# Patient Record
Sex: Female | Born: 1935 | Race: White | Hispanic: No | Smoking: Former smoker
Health system: Southern US, Community
[De-identification: ages and names within clinical notes are randomized; demographics above are authoritative.]

## PROBLEM LIST (undated history)

## (undated) DIAGNOSIS — R233 Spontaneous ecchymoses: Secondary | ICD-10-CM

## (undated) DIAGNOSIS — R238 Other skin changes: Secondary | ICD-10-CM

## (undated) DIAGNOSIS — J45909 Unspecified asthma, uncomplicated: Secondary | ICD-10-CM

## (undated) HISTORY — DX: Unspecified asthma, uncomplicated: J45.909

## (undated) HISTORY — PX: VESICOVAGINAL FISTULA CLOSURE W/ TAH: SUR271

## (undated) HISTORY — DX: Spontaneous ecchymoses: R23.3

## (undated) HISTORY — DX: Other skin changes: R23.8

---

## 2013-03-31 ENCOUNTER — Encounter: Payer: Self-pay | Admitting: Podiatry

## 2013-04-01 ENCOUNTER — Encounter: Payer: Self-pay | Admitting: Podiatry

## 2013-04-01 ENCOUNTER — Ambulatory Visit (INDEPENDENT_AMBULATORY_CARE_PROVIDER_SITE_OTHER): Payer: Medicare HMO | Admitting: Podiatry

## 2013-04-01 ENCOUNTER — Ambulatory Visit: Payer: Self-pay | Admitting: Podiatry

## 2013-04-01 VITALS — BP 130/56 | HR 85 | Resp 16 | Ht 62.0 in | Wt 131.4 lb

## 2013-04-01 DIAGNOSIS — Q828 Other specified congenital malformations of skin: Secondary | ICD-10-CM

## 2013-04-01 NOTE — Progress Notes (Signed)
She presents today with her daughter with a chief complaint of a painful fifth toe of the right foot set this corn is bothering me.  Objective: Vital signs are stable she is alert and oriented x3 pulses remain palpable right lower extremity. Hammertoe deformity the fifth digit of the right foot does demonstrate a distal porokeratotic lesion to the distal lateral aspect of the hammertoe deformity. There is no signs of infection no erythema edema cellulitis drainage or odor.  Assessment: Porokeratosis hammertoe deformity fifth right.  Plan: Debridement of reactive hyperkeratosis and placed moleskin.

## 2019-08-21 ENCOUNTER — Telehealth: Payer: Self-pay | Admitting: Nurse Practitioner

## 2019-08-21 NOTE — Telephone Encounter (Signed)
Spoke with patient's daughter Efraim Kaufmann regarding Palliative services and she was in agreement with this.  I have scheduled an In-person Consult for 08/31/19 @ 3:30 PM

## 2019-08-24 ENCOUNTER — Emergency Department: Payer: Medicare Other

## 2019-08-24 ENCOUNTER — Emergency Department
Admission: EM | Admit: 2019-08-24 | Discharge: 2019-08-24 | Disposition: A | Discharge status: Home / Self Care | Payer: Medicare Other | Attending: Emergency Medicine | Admitting: Emergency Medicine

## 2019-08-24 ENCOUNTER — Other Ambulatory Visit: Payer: Self-pay

## 2019-08-24 DIAGNOSIS — Z043 Encounter for examination and observation following other accident: Secondary | ICD-10-CM | POA: Diagnosis not present

## 2019-08-24 DIAGNOSIS — R410 Disorientation, unspecified: Secondary | ICD-10-CM | POA: Diagnosis not present

## 2019-08-24 DIAGNOSIS — Z79899 Other long term (current) drug therapy: Secondary | ICD-10-CM | POA: Diagnosis not present

## 2019-08-24 DIAGNOSIS — Y999 Unspecified external cause status: Secondary | ICD-10-CM | POA: Insufficient documentation

## 2019-08-24 DIAGNOSIS — J45909 Unspecified asthma, uncomplicated: Secondary | ICD-10-CM | POA: Diagnosis not present

## 2019-08-24 DIAGNOSIS — Y9389 Activity, other specified: Secondary | ICD-10-CM | POA: Diagnosis not present

## 2019-08-24 DIAGNOSIS — Z87891 Personal history of nicotine dependence: Secondary | ICD-10-CM | POA: Insufficient documentation

## 2019-08-24 DIAGNOSIS — Y92002 Bathroom of unspecified non-institutional (private) residence single-family (private) house as the place of occurrence of the external cause: Secondary | ICD-10-CM | POA: Diagnosis not present

## 2019-08-24 DIAGNOSIS — N390 Urinary tract infection, site not specified: Secondary | ICD-10-CM | POA: Diagnosis not present

## 2019-08-24 DIAGNOSIS — W19XXXA Unspecified fall, initial encounter: Secondary | ICD-10-CM

## 2019-08-24 LAB — COMPREHENSIVE METABOLIC PANEL
ALT: 13 U/L (ref 0–44)
AST: 13 U/L — ABNORMAL LOW (ref 15–41)
Albumin: 3.8 g/dL (ref 3.5–5.0)
Alkaline Phosphatase: 73 U/L (ref 38–126)
Anion gap: 7 (ref 5–15)
BUN: 40 mg/dL — ABNORMAL HIGH (ref 8–23)
CO2: 27 mmol/L (ref 22–32)
Calcium: 9.3 mg/dL (ref 8.9–10.3)
Chloride: 107 mmol/L (ref 98–111)
Creatinine, Ser: 0.97 mg/dL (ref 0.44–1.00)
GFR calc Af Amer: 56 mL/min — ABNORMAL LOW (ref >60)
GFR calc non Af Amer: 49 mL/min — ABNORMAL LOW (ref >60)
Glucose, Bld: 109 mg/dL — ABNORMAL HIGH (ref 70–99)
Potassium: 4.4 mmol/L (ref 3.5–5.1)
Sodium: 141 mmol/L (ref 135–145)
Total Bilirubin: 0.5 mg/dL (ref 0.3–1.2)
Total Protein: 6.2 g/dL — ABNORMAL LOW (ref 6.5–8.1)

## 2019-08-24 LAB — CBC
HCT: 35.8 % — ABNORMAL LOW (ref 36.0–46.0)
Hemoglobin: 10.8 g/dL — ABNORMAL LOW (ref 12.0–15.0)
MCH: 26 pg (ref 26.0–34.0)
MCHC: 30.2 g/dL (ref 30.0–36.0)
MCV: 86.3 fL (ref 80.0–100.0)
Platelets: 248 10*3/uL (ref 150–400)
RBC: 4.15 MIL/uL (ref 3.87–5.11)
RDW: 17.1 % — ABNORMAL HIGH (ref 11.5–15.5)
WBC: 11.3 10*3/uL — ABNORMAL HIGH (ref 4.0–10.5)
nRBC: 0 % (ref 0.0–0.2)

## 2019-08-24 LAB — URINALYSIS, COMPLETE (UACMP) WITH MICROSCOPIC
Bilirubin Urine: NEGATIVE
Glucose, UA: NEGATIVE mg/dL
Hgb urine dipstick: NEGATIVE
Ketones, ur: NEGATIVE mg/dL
Nitrite: POSITIVE — AB
Protein, ur: NEGATIVE mg/dL
Specific Gravity, Urine: 1.021 (ref 1.005–1.030)
pH: 7 (ref 5.0–8.0)

## 2019-08-24 MED ORDER — SODIUM CHLORIDE 0.9 % IV SOLN
1.0000 g | Freq: Once | INTRAVENOUS | Status: AC
  Administered 2019-08-24: 1 g via INTRAVENOUS
  Filled 2019-08-24: qty 10

## 2019-08-24 MED ORDER — SODIUM CHLORIDE 0.9% FLUSH: 3.0000 mL | Freq: Once | INTRAVENOUS | Status: DC

## 2019-08-24 MED ORDER — CEPHALEXIN 500 MG PO CAPS
500.0000 mg | ORAL_CAPSULE | Freq: Two times a day (BID) | ORAL | 0 refills | Status: DC
Start: 2019-08-24 — End: 2019-09-23

## 2019-08-24 NOTE — ED Notes (Signed)
Daughter states after looking at her phone, last time she talked with her mother, the pt was at 1230 and not 2:30

## 2019-08-24 NOTE — ED Triage Notes (Signed)
Pt BIB EMS after a fall. Pt states she fell in the bathroom and then woke up. EMS states  A strong urine smell with some confusion. Daughter states home health was with pt from 9-11am and daughter talked with pt at 2pm. Daughter thinks fall was after 2pm and found by neighbor.

## 2019-08-24 NOTE — ED Notes (Signed)
Pt is confused. Per daughter, this is pt baseline. Pt is verbally accusatory to staff. Lights turned off per pt request and then lights turned on per request. Pt adjusted in bed for comfort. Delay explained to family and pt that she requires IV antibiotics.

## 2019-08-24 NOTE — ED Provider Notes (Signed)
[facility] Emergency Department Provider Note  Time seen: 9:01 PM  I have reviewed the triage vital signs and the nursing notes.   HISTORY  Chief Complaint Fall   HPI Theresa Luna is a 86 y.o. female with a past medical history of asthma, presents to the emergency department after a fall.  According to patient family patient states she fell at some point getting to the bathroom.  She is not sure what time this was.  She is not sure if she lost consciousness and fell where she fell and lost consciousness.  Patient denies any complaints at this time.  Denies any headache, denies chest pain or shortness of breath at any point.  Overall the patient appears well, appears younger than stated age.   Past Medical History:  Diagnosis Date  . Asthma   . Bruises easily     There are no problems to display for this patient.   Past Surgical History:  Procedure Laterality Date  . VESICOVAGINAL FISTULA CLOSURE W/ TAH      Prior to Admission medications   Medication Sig Start Date End Date Taking? Authorizing Provider  ADVAIR DISKUS 250-50 MCG/DOSE AEPB  03/21/13   [provider]  D 1000 1000 UNITS CHEW  02/01/13   [provider]  Fexofenadine HCl (ALLEGRA PO) Take by mouth daily.    [provider]  fluticasone Aleda Grana) 50 MCG/ACT nasal spray  01/29/13   [provider]  montelukast (SINGULAIR) 10 MG tablet Take 10 mg by mouth at bedtime.    [provider]  moxifloxacin (AVELOX) 400 MG tablet  02/11/13   [provider]  nystatin (MYCOSTATIN) 100000 UNIT/ML suspension  03/17/13   [provider]  omeprazole (PRILOSEC) 40 MG capsule  03/17/13   [provider]  oxybutynin (DITROPAN-XL) 10 MG 24 hr tablet  02/09/13   [provider]  Steffanie Rainwater 180 MCG/ACT inhaler  03/14/13   [provider]  SPIRIVA HANDIHALER 18 MCG inhalation capsule  03/18/13   [provider]   Pauline Aus [facility] 45 MCG/ACT inhaler  03/19/13   [provider]    Allergies  Allergen Reactions  . Penicillins Rash    Family History  Problem Relation Age of Onset  . Cancer Father   . High blood pressure Mother     Social History Social History   Tobacco Use  . Smoking status: Former Games developer  . Smokeless tobacco: Never Used  Substance Use Topics  . Alcohol use: No  . Drug use: No    Review of Systems Constitutional: Negative for fever. Cardiovascular: Negative for chest pain. Respiratory: Negative for shortness of breath. Gastrointestinal: Negative for abdominal pain Genitourinary: Negative for urinary compaints Musculoskeletal: Negative for musculoskeletal complaints Skin: Negative for skin complaints  Neurological: Negative for headache All other ROS negative  ____________________________________________   PHYSICAL EXAM:  VITAL SIGNS: ED Triage Vitals  Enc Vitals Group     BP 08/24/19 1957 (!) 151/74     Pulse Rate 08/24/19 1957 85     Resp 08/24/19 1957 20     Temp 08/24/19 1957 98.1 F (36.7 C)     Temp Source 08/24/19 1957 Oral     SpO2 08/24/19 1957 94 %     Weight 08/24/19 1952 104 lb (47.2 kg)     Height 08/24/19 1952 5\' 1"  (1.549 m)     Head Circumference --      Peak Flow --  Pain Score 08/24/19 1952 0     Pain Loc --      Pain Edu? --      Excl. in [place]? --     Constitutional: Alert and oriented. Well appearing and in no distress. Eyes: Normal exam ENT      Head: Normocephalic and atraumatic.      Mouth/Throat: Mucous membranes are moist. Cardiovascular: Normal rate, regular rhythm. Respiratory: Normal respiratory effort without tachypnea nor retractions. Breath sounds are clear  Gastrointestinal: Soft and nontender. No distention. Musculoskeletal: Nontender with normal range of motion in all extremities.  Neurologic:  Normal speech and language. No gross focal neurologic deficits  Skin:  Skin is warm, dry and intact.   Psychiatric: Mood and affect are normal.   ____________________________________________    EKG  EKG viewed and interpreted by myself shows a normal sinus rhythm 84 bpm with a narrow QRS, normal axis, normal intervals, no concerning ST changes.  ____________________________________________    RADIOLOGY  IMPRESSION:  Chronic atrophic and ischemic changes without acute intracranial  abnormality.   ____________________________________________   INITIAL IMPRESSION / ASSESSMENT AND PLAN / ED COURSE  Pertinent labs & imaging results that were available during my care of the patient were reviewed by me and considered in my medical decision making (see chart for details).   Patient presents to the emergency department after a fall.  Unknown if she lost consciousness.  States they last tolerated 2 PM and then found her on the floor a little before 6 PM.  Patient's work-up is overall reassuring besides nitrite positive urinalysis.  We will treat with Rocephin and continue to closely monitor.  CT scan head is negative.  EKG reassuring.  Patient appears well.  Daughter is here with the patient feels comfortable taking her home.  We will likely discharge after antibiotics have completed.  Theresa Luna was evaluated in Emergency Department on 08/24/2019 for the symptoms described in the history of present illness. She was evaluated in the context of the global COVID-19 pandemic, which necessitated consideration that the patient might be at risk for infection with the SARS-CoV-2 virus that causes COVID-19. Institutional protocols and algorithms that pertain to the evaluation of patients at risk for COVID-19 are in a state of rapid change based on information released by regulatory bodies including the CDC and federal and state organizations. These policies and algorithms were followed during the patient's care in the ED.  ____________________________________________   FINAL CLINICAL IMPRESSION(S) /  ED DIAGNOSES  Fall Urinary tract infection   Harvest Dark, MD 08/24/19 2104

## 2019-08-27 LAB — URINE CULTURE: Culture: >=100000 — AB

## 2019-08-31 ENCOUNTER — Encounter: Payer: Self-pay | Admitting: Nurse Practitioner

## 2019-08-31 ENCOUNTER — Other Ambulatory Visit: Payer: Medicare Other | Admitting: Nurse Practitioner

## 2019-08-31 ENCOUNTER — Other Ambulatory Visit: Payer: Self-pay

## 2019-08-31 DIAGNOSIS — F01518 Vascular dementia, unspecified severity, with other behavioral disturbance: Secondary | ICD-10-CM

## 2019-08-31 DIAGNOSIS — Z515 Encounter for palliative care: Secondary | ICD-10-CM

## 2019-08-31 NOTE — Progress Notes (Signed)
Therapist, nutritional Palliative Care Consult Note Telephone: [phone]  Fax: [phone]  PATIENT NAME: Theresa Luna DOB: 24-Sep-1918 MRN: [id number]  PRIMARY CARE PROVIDER:   Woodroe Chen, MD  REFERRING PROVIDER:  Woodroe Chen, MD [id number] [facility] [place],  [place] [address]  RESPONSIBLE PARTY:   Daughter Theresa Luna [phone] or [phone]  I was asked by Theresa Luna to see Theresa Luna for Palliative care consult for goals of care  RECOMMENDATIONS and PLAN:  1. ACP: DNR, placed on Vynca  2. Palliative care encounter; Palliative medicine team will continue to support patient, patient's family, and medical team. Visit consisted of counseling and education dealing with the complex and emotionally intense issues of symptom management and palliative care in the setting of serious and potentially life-threatening illness  I spent 120  minutes providing this consultation,  from 3:00pm  to 5:00pm. More than 50% of the time in this consultation was spent coordinating communication.   HISTORY OF PRESENT ILLNESS:  Theresa Luna is a 86 y.o. year old female with multiple medical problems including Vascular dementia, copd, asthma, depression, vesicovaginal fistula closure with TAH. Per documentation from [facility] geriatrics 4 / 29 / 2021 continues to fall and has little insight into safety issues. She currently is residing and senior housing, does not like the food in like to eat alone. Per documentation Theresa Luna needs twenty-four-hour-a-day supervision but rejects most help as it is. BMI now 19 with current weight 104.0 pounds. COPD with acute exacerbation continues on daily prednisone and oxygen at night. Depression was less angry at this appointment not on antidepressant at this time. Theresa Luna was seen at [facility] emergency department 5 / 10 / 2021 for a fall unwitnessed. CT scan, EKGs negative with positive nitrite in in urine. Treated  for UTI. Albumin 3.8, total protein 6.2, hemoglobin 10.8. Palliative care visit set for discussion of goals of care. In person palliative care visit with Theresa Luna. And her daughter Theresa Luna. We talked about purpose of palliative care visit. We talked about Life review. 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 Theresa Luna is widowed and her only child Theresa Luna is currently present. Theresa Luna does not have any grandchildren. We talked about past medical history in the study of chronic disease. We talked about in the sense functional ability walking with a walker or rolaid Walker. We talked about falls that she has been having three in the last month. We talked about her ability to get herself dressed kind of days for what she does have caregivers coming in to help her with that. Theresa Luna endorses that she is able to help some with getting meals. Theresa Luna does feed herself. We talked about her declined appetite. We talked about her weight loss. We talked about resigning at home independently. We talked at length about increasing skill level. We talked about falls repeatedly. We talked about using her life line and the importance of. Theresa Luna endorse has that [facility] does come when the lifeline is pressed. Theresa Luna has been informed that they are concerned about the increase in calls to [name] apartment. We talked about options of Assisted Living. Theresa Luna became very upset arguing with Theresa Luna her daughter. Theresa Luna endorses that she is independent and can take care of herself. We talked at length about concerns for difficulty with memory, increase in falls. Theresa Luna is on  oxygen at night. Theresa Luna is currently taking prednisone daily for COPD. We talked about last COPD exacerbation. We talked about concern for safety and being home alone. We talked about option of more care in the home. [name] endorses she does not want anybody else coming in her home  she doesn't need any more care. We talked about the challenges with her memory. [name] endorses she is having a harder time remembering things. We talked about option of possibly having hospice for extra support. [name] decline stating that she does not want people in her home. We talked about option of Assisted Living. [name] endorses that she does not want to leave her home that she can take care of herself. Discussion continue to go back and forth about her concerns about an assisted living while she became very angry and upset. Subject change to things that she likes to do including Hobbies. [name] endorses that she likes to read a lot. [name] talked about going to musicals. [name] talked about getting old. [name] talked about loss of Independence. We talked about taking one day at a time. [name] endorses that she only thinks about the here and now. [name] does not want to plan for the future. We talked about role of palliative care plan of care. We talked about medical goals of care and DNR is already in place on her fridgerator. Will place in Vynca/ epic copy. We talked about follow up palliative care visit in 2 weeks for further discussion of medical goals of care. [name], [name] both in listening and emotional support provided. Contact information. Questions answered to satisfaction. Asked to speak to [facility] separately and in agreement. Theresa Luna and I talked about the safety concern living independently. We talked about options of Theresa Luna looking at Assisted Living, starting that process of transitioning  to an assisted living. We talked about the process of requiring FL2. We talked about ways of approaching this end about transitioning to Assisted Living Facility. Theresa Luna endorses she can not keep getting phone calls in the middle of the night it is affecting her health. We talked about chronic disease progression of dementia. We talked about her current ability to  reason and process, poor judgment. We talked about not arguing with Ms. Danielsen very simple with responses not trying to justify reasoning. Theresa Luna in agreement. Theresa Luna endorses that she is understanding now she may need to take a different approach. During the time Theresa Luna and I were speaking outside Ms. Crotwell came out the door screaming for Theresa Luna multiple times. Discuss with Theresa Luna will mail information concerning palliative care, [employer] book. Theresa Luna in agreement. Therapeutic listening and emotional support provided. Questions answered to satisfaction.  06/30/2019 weight 106.6 lbs 08/13/2019 weight 104 lbs  Palliative Care was asked to help address goals of care.   CODE STATUS: DNR  PPS: 50% HOSPICE ELIGIBILITY/DIAGNOSIS: TBD  PAST MEDICAL HISTORY:  Past Medical History:  Diagnosis Date  . Asthma   . Bruises easily     SOCIAL HX:  Social History   Tobacco Use  . Smoking status: Former Games developer  . Smokeless tobacco: Never Used  Substance Use Topics  . Alcohol use: No    ALLERGIES:  Allergies  Allergen Reactions  . Penicillins Rash     PERTINENT MEDICATIONS:  Outpatient Encounter Medications as of 08/31/2019  Medication Sig  . ADVAIR DISKUS 250-50 MCG/DOSE AEPB   . cephALEXin (KEFLEX) 500 MG capsule Take 1 capsule (  500 mg total) by mouth 2 (two) times daily.  . D 1000 1000 UNITS CHEW   . Fexofenadine HCl (ALLEGRA PO) Take by mouth daily.  . fluticasone (FLONASE) 50 MCG/ACT nasal spray   . montelukast (SINGULAIR) 10 MG tablet Take 10 mg by mouth at bedtime.  . moxifloxacin (AVELOX) 400 MG tablet   . nystatin (MYCOSTATIN) 100000 UNIT/ML suspension   . omeprazole (PRILOSEC) 40 MG capsule   . oxybutynin (DITROPAN-XL) 10 MG 24 hr tablet   . PULMICORT FLEXHALER 180 MCG/ACT inhaler   . SPIRIVA HANDIHALER 18 MCG inhalation capsule   . XOPENEX HFA 45 MCG/ACT inhaler    No facility-administered encounter medications on file as of 08/31/2019.    PHYSICAL EXAM:    General: NAD, frail appearing, thin, elderly forgetful female Cardiovascular: regular rate and rhythm Pulmonary: clear ant fields Neurological: walks with walker  Jeiry Birnbaum Ihor Gully, NP

## 2019-09-02 ENCOUNTER — Telehealth: Payer: Self-pay

## 2019-09-02 NOTE — Telephone Encounter (Signed)
Palliative care SW completed follow-up call to patient's daughter to assess needs, provided education regarding placement and medicaid and provide support to her. SW provided education regarding placement process, reviewed packet that daughter received from the facility she is considering moving patient to from current independent living facility. Education regarding medicaid, how to access the application and turn it in was provided. SW also provided active and reflective listening, emotional support, encouragement, normalized and validated her feelings regarding moving her mother to different level of care when she is resistant. Ms. Theresa Luna verbalized understanding of education provided and thanked SW for follow-up call and support provided.  *Palliative care NP-Christin will be updated for follow-up.    Duration of call and documentation: 50 minutes

## 2019-09-08 ENCOUNTER — Other Ambulatory Visit: Payer: Medicare Other | Admitting: Nurse Practitioner

## 2019-09-08 ENCOUNTER — Encounter: Payer: Self-pay | Admitting: Nurse Practitioner

## 2019-09-08 ENCOUNTER — Other Ambulatory Visit: Payer: Self-pay

## 2019-09-08 DIAGNOSIS — Z515 Encounter for palliative care: Secondary | ICD-10-CM

## 2019-09-08 DIAGNOSIS — F01518 Vascular dementia, unspecified severity, with other behavioral disturbance: Secondary | ICD-10-CM

## 2019-09-08 NOTE — Progress Notes (Signed)
[facility] Consult Note Telephone: [phone]  Fax: [phone]  PATIENT NAME: Theresa Luna DOB: 1919/03/25 MRN: [id number]  PRIMARY CARE PROVIDER:   Joseph Berkshire, MD  REFERRING PROVIDER:  Joseph Berkshire, MD [facility] [place],  [place] [address]  RESPONSIBLE PARTY:   Daughter Cindi Carbon [phone] or [phone]  I was asked by Dr Allison Quarry to see Ms. Canela for Palliative care consult for goals of care  RECOMMENDATIONS and PLAN: 1.ACP: DNR, placed on Vynca 2.Palliative care encounter; Palliative medicine team will continue to support patient, patient's family, and medical team. Visit consisted of counseling and education dealing with the complex and emotionally intense issues of symptom management and palliative care in the setting of serious and potentially life-threatening illness  I spent 60 minutes providing this consultation,  from 10:00am to 11:00am. More than 50% of the time in this consultation was spent coordinating communication.   HISTORY OF PRESENT ILLNESS:  Kinsleigh Ludolph is a 86 y.o. year old female with multiple medical problems including Vascular dementia, copd, asthma, depression, vesicovaginal fistula closure with TAH. Per documentation from [facility] geriatrics 4 / 29 / 2021 continues to fall and has little insight into safety issues. In-person follow-up palliative care visit. I visited with Ms Claxton, caregiver, Luan Moore and daughter Lenna Sciara. We talked about purpose of palliative care visit. Melissa an agreement. Lenna Sciara is Health Care power-of-attorney. We talked about how Ms Marcos has been feeling. Ms. Randal endorses she has good days and bad days that are more difficult. Ms Brasington endorses she is having more trouble with her memory. We talked about symptoms of pain but she is experiencing in her left flank that only lasted for a few minutes and now is resolved. We talked  about her appetite being poor. Ms Jacques endorses her appetite is good though there's some things that she does not like. We talked about no falls since last palliative care visit. We talked about walking with her walker and safety. We talked about Urban Gibson her caregiver that comes 2 hours in the morning and two hours in the evening. We talked about fall risk. We talked about her sleeping at night. Ms Carneiro endorses she has been sleeping during the night without difficulty. Melissa endorses Ms. Ijames has not received any middle of the night phone calls lately. We talked about medical goals of care. We talked about Ms Vega decrease in vision which makes it difficult for her to enjoy the things that Ms. Porcher likes. We talked about TV and the things that she does during the day, sits in her chair. We talked about weakness, fatigue and shortness of breath. We talked about Ms Nissen being currently on oxygen at night. We talked about that she make require oxygen during the day if she is becoming more fatigued. We talked about caregiver fatigue and coping strategies. We talked about transition from independent home with caregivers to a facility with expectations of Ms Zane how she will react with behaviors. We talked about upcoming primary appointment with provider. Discussed ensure that they checked to see if she does require continuous oxygen and a portable tank. We talked about severe fatigue and dyspnea. We talked about role of palliative care and plan of care. We talked about follow up palliative care appointment after her primary care appointment next week. Schedule palliative care visit in 2 weeks. Melissa and miss hand them both in agreement. Therapeutic listening and emotional support provided.  Questions answered to satisfaction. Asked to speak with Melissa separately. Melissa an agreement. Melissa and I talked about medical goals of care. We talked about facility placement. Melissa and door says that she  has her appointment also to get the [facility] completed. We talked about the facilities she did look at including in [facility] [place]. We did talk about palliative care does go to those facilities. We talked about Hospice under Medicare benefit do at this point decline is not significant enough though will continue to monitor and follow with palliative care closely. Melissa in agreement. Therapeutic listening and emotional support provided. Contact information. Information given on facilities available for placement.   Palliative Care was asked to help to continue to address goals of care.   CODE STATUS: DNR  PPS: 50% HOSPICE ELIGIBILITY/DIAGNOSIS: TBD  PAST MEDICAL HISTORY:  Past Medical History:  Diagnosis Date  . Asthma   . Bruises easily     SOCIAL HX:  Social History   Tobacco Use  . Smoking status: Former Games developer  . Smokeless tobacco: Never Used  Substance Use Topics  . Alcohol use: No    ALLERGIES:  Allergies  Allergen Reactions  . Penicillins Rash     PERTINENT MEDICATIONS:  Outpatient Encounter Medications as of 09/08/2019  Medication Sig  . ADVAIR DISKUS 250-50 MCG/DOSE AEPB   . cephALEXin (KEFLEX) 500 MG capsule Take 1 capsule (500 mg total) by mouth 2 (two) times daily.  . D 1000 1000 UNITS CHEW   . Fexofenadine HCl (ALLEGRA PO) Take by mouth daily.  . fluticasone (FLONASE) 50 MCG/ACT nasal spray   . montelukast (SINGULAIR) 10 MG tablet Take 10 mg by mouth at bedtime.  . moxifloxacin (AVELOX) 400 MG tablet   . nystatin (MYCOSTATIN) 100000 UNIT/ML suspension   . omeprazole (PRILOSEC) 40 MG capsule   . oxybutynin (DITROPAN-XL) 10 MG 24 hr tablet   . PULMICORT FLEXHALER 180 MCG/ACT inhaler   . SPIRIVA HANDIHALER 18 MCG inhalation capsule   . XOPENEX HFA 45 MCG/ACT inhaler    No facility-administered encounter medications on file as of 09/08/2019.    PHYSICAL EXAM:   General: NAD, frail appearing, thin, female Cardiovascular: regular rate and  rhythm Pulmonary: clear ant fields Neurological: Walks with walker  Jefferie Holston [place], NP

## 2019-09-19 ENCOUNTER — Emergency Department: Payer: Medicare Other

## 2019-09-19 ENCOUNTER — Inpatient Hospital Stay
Admission: EM | Admit: 2019-09-19 | Discharge: 2019-09-23 | DRG: 602 | Disposition: A | Discharge status: Skilled Nursing Facility | Payer: Medicare Other | Attending: Internal Medicine | Admitting: Internal Medicine

## 2019-09-19 ENCOUNTER — Other Ambulatory Visit: Payer: Self-pay

## 2019-09-19 DIAGNOSIS — Z79899 Other long term (current) drug therapy: Secondary | ICD-10-CM

## 2019-09-19 DIAGNOSIS — W19XXXA Unspecified fall, initial encounter: Secondary | ICD-10-CM

## 2019-09-19 DIAGNOSIS — F0150 Vascular dementia without behavioral disturbance: Secondary | ICD-10-CM

## 2019-09-19 DIAGNOSIS — E860 Dehydration: Secondary | ICD-10-CM

## 2019-09-19 DIAGNOSIS — L03116 Cellulitis of left lower limb: Secondary | ICD-10-CM

## 2019-09-19 DIAGNOSIS — Z66 Do not resuscitate: Secondary | ICD-10-CM | POA: Diagnosis present

## 2019-09-19 DIAGNOSIS — I872 Venous insufficiency (chronic) (peripheral): Secondary | ICD-10-CM

## 2019-09-19 DIAGNOSIS — Z20822 Contact with and (suspected) exposure to covid-19: Secondary | ICD-10-CM | POA: Diagnosis present

## 2019-09-19 DIAGNOSIS — F05 Delirium due to known physiological condition: Secondary | ICD-10-CM | POA: Diagnosis not present

## 2019-09-19 DIAGNOSIS — J45909 Unspecified asthma, uncomplicated: Secondary | ICD-10-CM | POA: Diagnosis present

## 2019-09-19 DIAGNOSIS — S81801A Unspecified open wound, right lower leg, initial encounter: Secondary | ICD-10-CM

## 2019-09-19 DIAGNOSIS — F329 Major depressive disorder, single episode, unspecified: Secondary | ICD-10-CM | POA: Diagnosis present

## 2019-09-19 DIAGNOSIS — S2242XA Multiple fractures of ribs, left side, initial encounter for closed fracture: Secondary | ICD-10-CM | POA: Diagnosis present

## 2019-09-19 DIAGNOSIS — S40022A Contusion of left upper arm, initial encounter: Secondary | ICD-10-CM | POA: Diagnosis present

## 2019-09-19 DIAGNOSIS — R296 Repeated falls: Secondary | ICD-10-CM | POA: Diagnosis present

## 2019-09-19 DIAGNOSIS — I129 Hypertensive chronic kidney disease with stage 1 through stage 4 chronic kidney disease, or unspecified chronic kidney disease: Secondary | ICD-10-CM | POA: Diagnosis present

## 2019-09-19 DIAGNOSIS — R630 Anorexia: Secondary | ICD-10-CM | POA: Diagnosis present

## 2019-09-19 DIAGNOSIS — G9341 Metabolic encephalopathy: Secondary | ICD-10-CM | POA: Diagnosis present

## 2019-09-19 DIAGNOSIS — L03115 Cellulitis of right lower limb: Secondary | ICD-10-CM

## 2019-09-19 DIAGNOSIS — R64 Cachexia: Secondary | ICD-10-CM | POA: Diagnosis present

## 2019-09-19 DIAGNOSIS — S8011XA Contusion of right lower leg, initial encounter: Secondary | ICD-10-CM | POA: Diagnosis present

## 2019-09-19 DIAGNOSIS — Z681 Body mass index (BMI) 19 or less, adult: Secondary | ICD-10-CM

## 2019-09-19 DIAGNOSIS — D519 Vitamin B12 deficiency anemia, unspecified: Secondary | ICD-10-CM

## 2019-09-19 DIAGNOSIS — R410 Disorientation, unspecified: Secondary | ICD-10-CM

## 2019-09-19 DIAGNOSIS — Z880 Allergy status to penicillin: Secondary | ICD-10-CM

## 2019-09-19 DIAGNOSIS — L8990 Pressure ulcer of unspecified site, unspecified stage: Secondary | ICD-10-CM | POA: Insufficient documentation

## 2019-09-19 DIAGNOSIS — S8012XA Contusion of left lower leg, initial encounter: Secondary | ICD-10-CM | POA: Diagnosis present

## 2019-09-19 DIAGNOSIS — Z7951 Long term (current) use of inhaled steroids: Secondary | ICD-10-CM

## 2019-09-19 DIAGNOSIS — S40021A Contusion of right upper arm, initial encounter: Secondary | ICD-10-CM | POA: Diagnosis present

## 2019-09-19 DIAGNOSIS — Z888 Allergy status to other drugs, medicaments and biological substances status: Secondary | ICD-10-CM

## 2019-09-19 DIAGNOSIS — Z87891 Personal history of nicotine dependence: Secondary | ICD-10-CM

## 2019-09-19 DIAGNOSIS — R4182 Altered mental status, unspecified: Secondary | ICD-10-CM | POA: Diagnosis present

## 2019-09-19 DIAGNOSIS — Z8249 Family history of ischemic heart disease and other diseases of the circulatory system: Secondary | ICD-10-CM

## 2019-09-19 DIAGNOSIS — N1832 Chronic kidney disease, stage 3b: Secondary | ICD-10-CM | POA: Diagnosis present

## 2019-09-19 LAB — CBC WITH DIFFERENTIAL/PLATELET
Abs Immature Granulocytes: 0.11 10*3/uL — ABNORMAL HIGH (ref 0.00–0.07)
Basophils Absolute: 0 10*3/uL (ref 0.0–0.1)
Basophils Relative: 0 %
Eosinophils Absolute: 0 10*3/uL (ref 0.0–0.5)
Eosinophils Relative: 0 %
HCT: 33.7 % — ABNORMAL LOW (ref 36.0–46.0)
Hemoglobin: 10 g/dL — ABNORMAL LOW (ref 12.0–15.0)
Immature Granulocytes: 1 %
Lymphocytes Relative: 5 %
Lymphs Abs: 0.7 10*3/uL (ref 0.7–4.0)
MCH: 26.1 pg (ref 26.0–34.0)
MCHC: 29.7 g/dL — ABNORMAL LOW (ref 30.0–36.0)
MCV: 88 fL (ref 80.0–100.0)
Monocytes Absolute: 0.3 10*3/uL (ref 0.1–1.0)
Monocytes Relative: 2 %
Neutro Abs: 11.7 10*3/uL — ABNORMAL HIGH (ref 1.7–7.7)
Neutrophils Relative %: 92 %
Platelets: 225 10*3/uL (ref 150–400)
RBC: 3.83 MIL/uL — ABNORMAL LOW (ref 3.87–5.11)
RDW: 17.1 % — ABNORMAL HIGH (ref 11.5–15.5)
WBC: 12.8 10*3/uL — ABNORMAL HIGH (ref 4.0–10.5)
nRBC: 0 % (ref 0.0–0.2)

## 2019-09-19 LAB — COMPREHENSIVE METABOLIC PANEL
ALT: 13 U/L (ref 0–44)
AST: 14 U/L — ABNORMAL LOW (ref 15–41)
Albumin: 3.7 g/dL (ref 3.5–5.0)
Alkaline Phosphatase: 53 U/L (ref 38–126)
Anion gap: 8 (ref 5–15)
BUN: 33 mg/dL — ABNORMAL HIGH (ref 8–23)
CO2: 29 mmol/L (ref 22–32)
Calcium: 8.8 mg/dL — ABNORMAL LOW (ref 8.9–10.3)
Chloride: 103 mmol/L (ref 98–111)
Creatinine, Ser: 1.09 mg/dL — ABNORMAL HIGH (ref 0.44–1.00)
GFR calc Af Amer: 49 mL/min — ABNORMAL LOW (ref >60)
GFR calc non Af Amer: 42 mL/min — ABNORMAL LOW (ref >60)
Glucose, Bld: 125 mg/dL — ABNORMAL HIGH (ref 70–99)
Potassium: 4.1 mmol/L (ref 3.5–5.1)
Sodium: 140 mmol/L (ref 135–145)
Total Bilirubin: 0.7 mg/dL (ref 0.3–1.2)
Total Protein: 5.9 g/dL — ABNORMAL LOW (ref 6.5–8.1)

## 2019-09-19 LAB — PROTIME-INR
INR: 1 (ref 0.8–1.2)
Prothrombin Time: 12.3 seconds (ref 11.4–15.2)

## 2019-09-19 LAB — CBC
HCT: 33.5 % — ABNORMAL LOW (ref 36.0–46.0)
Hemoglobin: 10 g/dL — ABNORMAL LOW (ref 12.0–15.0)
MCH: 26.2 pg (ref 26.0–34.0)
MCHC: 29.9 g/dL — ABNORMAL LOW (ref 30.0–36.0)
MCV: 87.7 fL (ref 80.0–100.0)
Platelets: 220 10*3/uL (ref 150–400)
RBC: 3.82 MIL/uL — ABNORMAL LOW (ref 3.87–5.11)
RDW: 17.1 % — ABNORMAL HIGH (ref 11.5–15.5)
WBC: 12.7 10*3/uL — ABNORMAL HIGH (ref 4.0–10.5)
nRBC: 0 % (ref 0.0–0.2)

## 2019-09-19 LAB — LACTIC ACID, PLASMA: Lactic Acid, Venous: 1 mmol/L (ref 0.5–1.9)

## 2019-09-19 LAB — TROPONIN I (HIGH SENSITIVITY)
Troponin I (High Sensitivity): 9 ng/L (ref <18)
Troponin I (High Sensitivity): 11 ng/L (ref <18)

## 2019-09-19 LAB — ETHANOL: Alcohol, Ethyl (B): <10 mg/dL (ref <10)

## 2019-09-19 MED ORDER — LORAZEPAM 2 MG/ML IJ SOLN
1.0000 mg | Freq: Once | INTRAMUSCULAR | Status: AC
  Administered 2019-09-19: 1 mg via INTRAVENOUS
  Filled 2019-09-19: qty 1

## 2019-09-19 MED ORDER — MONTELUKAST SODIUM 10 MG PO TABS
10.0000 mg | ORAL_TABLET | Freq: Every day | ORAL | Status: DC
  Administered 2019-09-20 – 2019-09-21 (×2): 10 mg via ORAL
  Filled 2019-09-19 (×2): qty 1

## 2019-09-19 MED ORDER — PREDNISONE 10 MG PO TABS
10.0000 mg | ORAL_TABLET | Freq: Every morning | ORAL | Status: DC
  Administered 2019-09-20 – 2019-09-23 (×3): 10 mg via ORAL
  Filled 2019-09-19 (×4): qty 1

## 2019-09-19 MED ORDER — LORAZEPAM 2 MG/ML IJ SOLN
INTRAMUSCULAR | Status: AC
  Filled 2019-09-19: qty 1

## 2019-09-19 MED ORDER — PSYLLIUM 95 % PO PACK
1.0000 | PACK | Freq: Every day | ORAL | Status: DC
  Administered 2019-09-20: 1 via ORAL
  Filled 2019-09-19 (×5): qty 1

## 2019-09-19 MED ORDER — ALBUTEROL SULFATE HFA 108 (90 BASE) MCG/ACT IN AERS: 2.0000 | INHALATION_SPRAY | RESPIRATORY_TRACT | Status: DC | PRN

## 2019-09-19 MED ORDER — PANTOPRAZOLE SODIUM 40 MG PO TBEC
40.0000 mg | DELAYED_RELEASE_TABLET | Freq: Every day | ORAL | Status: DC
  Administered 2019-09-20 – 2019-09-23 (×3): 40 mg via ORAL
  Filled 2019-09-19 (×5): qty 1

## 2019-09-19 MED ORDER — HALOPERIDOL LACTATE 5 MG/ML IJ SOLN
2.0000 mg | Freq: Once | INTRAMUSCULAR | Status: AC
  Administered 2019-09-19: 2 mg via INTRAVENOUS
  Filled 2019-09-19: qty 1

## 2019-09-19 MED ORDER — ACETAMINOPHEN 325 MG PO TABS
650.0000 mg | ORAL_TABLET | Freq: Two times a day (BID) | ORAL | Status: DC
  Administered 2019-09-20 – 2019-09-23 (×5): 650 mg via ORAL
  Filled 2019-09-19 (×7): qty 2

## 2019-09-19 MED ORDER — SODIUM CHLORIDE 0.9 % IV SOLN: Freq: Once | INTRAVENOUS | Status: AC

## 2019-09-19 MED ORDER — ALBUTEROL SULFATE (2.5 MG/3ML) 0.083% IN NEBU
2.5000 mg | INHALATION_SOLUTION | RESPIRATORY_TRACT | Status: DC | PRN
  Administered 2019-09-20 – 2019-09-22 (×3): 2.5 mg via RESPIRATORY_TRACT
  Filled 2019-09-19 (×3): qty 3

## 2019-09-19 NOTE — ED Triage Notes (Signed)
Pt to ED via EMS from assisted living [facility] [employer]- pt had a fall and EMS states they noticed a bruise on her forehead between her eyes- per EMS pt was confused and could not tell them where she was or what day of the week it was- pt became combative and EMS called in and got  Verbal order from Dr Colon Branch to give 2 of IM versed which was given in the R shoulder- pt has a hx of COPD

## 2019-09-19 NOTE — ED Provider Notes (Addendum)
[facility] Emergency Department Provider Note   ____________________________________________   First MD Initiated Contact with Patient 09/19/19 1508     (approximate)  I have reviewed the triage vital signs and the nursing notes.   HISTORY  Chief Complaint Altered Mental Status and Fall   HPI Theresa Luna is a 86 y.o. female patient brought in by EMS.  EMS reports they received a call they got there she was on the floor and could not get up.  They had to help her up she became combative.  She was given 2 mg of Versed IM.  She came in here and is now insisting she did not fall and she wants to go home.  Her daughter reports she is living at home by herself with home health coming in every day.  Rest of the time she is alone.  She is fallen multiple times and can never get up by herself.  She is becoming combative.  She is confused and does not know what is happening most much of the time.  This has been going on since April.         Past Medical History:  Diagnosis Date  . Asthma   . Bruises easily     There are no problems to display for this patient.   Past Surgical History:  Procedure Laterality Date  . VESICOVAGINAL FISTULA CLOSURE W/ TAH      Prior to Admission medications   Medication Sig Start Date End Date Taking? Authorizing Provider  ADVAIR DISKUS 250-50 MCG/DOSE AEPB  03/21/13   [provider]  cephALEXin (KEFLEX) 500 MG capsule Take 1 capsule (500 mg total) by mouth 2 (two) times daily. Patient not taking: Reported on 09/19/2019 08/24/19   Minna Antis, MD  D 1000 1000 UNITS CHEW  02/01/13   [provider]  Fexofenadine HCl (ALLEGRA PO) Take by mouth daily.    [provider]  fluticasone Aleda Grana) 50 MCG/ACT nasal spray  01/29/13   [provider]  montelukast (SINGULAIR) 10 MG tablet Take 10 mg by mouth at bedtime.    [provider]  moxifloxacin (AVELOX) 400 MG tablet  02/11/13    [provider]  nystatin (MYCOSTATIN) 100000 UNIT/ML suspension  03/17/13   [provider]  omeprazole (PRILOSEC) 40 MG capsule  03/17/13   [provider]  oxybutynin (DITROPAN-XL) 10 MG 24 hr tablet  02/09/13   [provider]  Steffanie Rainwater 180 MCG/ACT inhaler  03/14/13   [provider]  SPIRIVA HANDIHALER 18 MCG inhalation capsule  03/18/13   [provider]  Pauline Aus [facility] 45 MCG/ACT inhaler  03/19/13   [provider]    Allergies Mirtazapine and Penicillins  Family History  Problem Relation Age of Onset  . High blood pressure Mother   . Cancer Father     Social History Social History   Tobacco Use  . Smoking status: Former Games developer  . Smokeless tobacco: Never Used  Substance Use Topics  . Alcohol use: No  . Drug use: No    Review of Systems  Constitutional: No fever/chills Eyes: No visual changes. ENT: No sore throat. Cardiovascular: Denies chest pain. Respiratory: Denies shortness of breath. Gastrointestinal: No abdominal pain.  No nausea, no vomiting.  No diarrhea.  No constipation. Genitourinary: Negative for dysuria. Musculoskeletal: Negative for back pain. Skin: Negative for rash. Neurological: Negative for headaches, focal weakness   ____________________________________________   PHYSICAL EXAM:  VITAL SIGNS: ED Triage Vitals  Enc Vitals Group     BP 09/19/19 1435 (!) 130/59     Pulse Rate 09/19/19 1435 88     Resp 09/19/19 1435 20     Temp 09/19/19 1436 97.6 F (36.4 C)     Temp Source 09/19/19 1436 Oral     SpO2 09/19/19 1435 91 %     Weight 09/19/19 1433 105 lb 13.1 oz (48 kg)     Height 09/19/19 1433 5\' 1"  (1.549 m)     Head Circumference --      Peak Flow --      Pain Score 09/19/19 1431 0     Pain Loc --      Pain Edu? --      Excl. in GC? --     Constitutional: Alert but confused denies falling denies being brought here by EMS does not know who I am we have not been  introduced myself several times. Eyes: Conjunctivae are normal.  Head: Possible bruise on the forehead in the middle between the eyes Nose: No congestion/rhinnorhea. Mouth/Throat: Mucous membranes are moist.  Oropharynx non-erythematous. Neck: No stridor.  No cervical spine tenderness to palpation. Cardiovascular: Normal rate, regular rhythm. Grossly normal heart sounds.  Good peripheral circulation. Respiratory: Normal respiratory effort.  No retractions. Lungs CTAB. Gastrointestinal: Soft and nontender. No distention. No abdominal bruits. No CVA tenderness. Musculoskeletal: There are bilateral venous stasis changes in the leg and a silver dollar sized probably venous stasis ulcer on the back of the right leg.  This does not look infected.  Home health has been dressing it but apparently did not do it today we will dress it today.  Daughter reports patient also takes dressings off frequently. Neurologic:  Normal speech and language. No gross focal neurologic deficits are appreciated. No gait instability. Skin:  Skin is warm, dry and intact. No rash noted. Psychiatric: Mood and affect are normal. Speech and behavior are normal.  ____________________________________________   LABS (all labs ordered are listed, but only abnormal results are displayed)  Labs Reviewed  COMPREHENSIVE METABOLIC PANEL - Abnormal; Notable for the following components:      Result Value   Glucose, Bld 125 (*)    BUN 33 (*)    Creatinine, Ser 1.09 (*)    Calcium 8.8 (*)    Total Protein 5.9 (*)    AST 14 (*)    GFR calc non Af Amer 42 (*)    GFR calc Af Amer 49 (*)    All other components within normal limits  CBC - Abnormal; Notable for the following components:   WBC 12.7 (*)    RBC 3.82 (*)    Hemoglobin 10.0 (*)    HCT 33.5 (*)    MCHC 29.9 (*)    RDW 17.1 (*)    All other components within normal limits  CBC WITH DIFFERENTIAL/PLATELET - Abnormal; Notable for the following components:   WBC 12.8 (*)     RBC 3.83 (*)    Hemoglobin 10.0 (*)    HCT 33.7 (*)    MCHC 29.7 (*)    RDW 17.1 (*)    Neutro Abs 11.7 (*)    Abs Immature Granulocytes 0.11 (*)    All other components within normal limits  PROTIME-INR  ETHANOL  LACTIC ACID, PLASMA  URINALYSIS, COMPLETE (UACMP) WITH MICROSCOPIC  URINE DRUG SCREEN, QUALITATIVE ([facility] ONLY)  VITAMIN B12  FOLATE RBC  TSH  TROPONIN I (HIGH SENSITIVITY)  TROPONIN I (HIGH SENSITIVITY)  ____________________________________________  EKG   ____________________________________________  RADIOLOGY  ED MD interpretation: Radiology reads the chest x-ray is left lower lobe atelectasis possible effusion and suspect acute fractures left ribs.  Patient does not have any left sided rib tenderness.  She does have known rib fractures I spent suspect that these are the old fractures.  I do not see any sign of pneumonia.  CT of the head read by radiology reviewed by me shows only atrophy.  Official radiology report(s): CT Head Wo Contrast  Result Date: 09/19/2019 CLINICAL DATA:  Head trauma, found down post fall, frontal bruise, confusion EXAM: CT HEAD WITHOUT CONTRAST TECHNIQUE: Contiguous axial images were obtained from the base of the skull through the vertex without intravenous contrast. Sagittal and coronal MPR images reconstructed from axial data set. COMPARISON:  08/24/2019 FINDINGS: Brain: Generalized atrophy with mild ex vacuo dilatation of the ventricular system. No midline shift or mass effect. Small vessel chronic ischemic changes of deep cerebral white matter. No intracranial hemorrhage, mass lesion or evidence of acute infarction. No extra-axial fluid collections. Vascular: Atherosclerotic calcification of internal carotid arteries at skull base Skull: Demineralized but intact Sinuses/Orbits: Partial opacification of ethmoid air cells bilaterally as well as inferior RIGHT mastoid air cells, unchanged. Nasal septal deviation to the LEFT. Other: N/A  IMPRESSION: Atrophy with small vessel chronic ischemic changes of deep cerebral white matter. No acute intracranial abnormalities. Mild chronic sinus changes as above. Electronically Signed   By: Ulyses Southward M.D.   On: 09/19/2019 17:06   DG Chest Portable 1 View  Result Date: 09/19/2019 CLINICAL DATA:  Altered mental status. Fall. History of asthma and smoking. EXAM: PORTABLE CHEST 1 VIEW COMPARISON:  None. FINDINGS: Heart size is normal. Lungs are free of focal consolidations. There is a small LEFT pleural effusion. Opacity at the LATERAL LEFT lung base may be related to atelectasis or scarring. There is deformity of LATERAL LEFT ribs, including the LEFT 6, 7, 8 ribs, adjacent to the region of the LEFT pleural and parenchymal changes. Findings are favored to represent acute fractures but could be chronic. No pneumothorax. IMPRESSION: 1. LEFT LOWER lobe atelectasis, contusion, or scarring and small LEFT effusion. 2. Suspect acute fractures of the LEFT LATERAL ribs 6, 7, and 8. Electronically Signed   By: Norva Pavlov M.D.   On: 09/19/2019 15:53    ____________________________________________   PROCEDURES  Procedure(s) performed (including Critical Care): Critical care time 45 minutes.  This includes discussing things with the patient repeatedly as well as with her daughter.  I also reviewed the old records that I can find and spoke with hospitalist.  Procedures   ____________________________________________   INITIAL IMPRESSION / ASSESSMENT AND PLAN / ED COURSE  Patient with frequent falls and worsening confusion and some combativeness.  She does not sound safe at home and she does not appear to be competent to maintain her own care.  She cannot remember falling today she cannot remember seeing me earlier during the day she cannot remember EMS come picking her up.  Her daughter does not feel she is safe to be at home by herself most of the day even with home health coming every day and the  daughter herself coming and visiting every other day.  I agree with the daughter.  Patient refuses to stay therefore I committed her.  We will get social work done case management to see her and we will get psych to see her TTS is already seeing her.  We will work  on placing her as soon as we can.             ____________________________________________   FINAL CLINICAL IMPRESSION(S) / ED DIAGNOSES  Final diagnoses:  Confusion  Dehydration  Fall, initial encounter  Leg wound, right, initial encounter  Cellulitis of right lower extremity  Venous stasis dermatitis of both lower extremities     ED Discharge Orders    None       Note:  This document was prepared using Dragon voice recognition software and may include unintentional dictation errors.    Nena Polio, MD 09/19/19 2221    Nena Polio, MD 09/19/19 [id number]

## 2019-09-19 NOTE — BH Assessment (Signed)
Assessment Note  Theresa Luna is an 86 y.o. female who presents to the ER due to her daughter had concerns about the recent changes in her behaviors and falls. Per the daughter, the patient behaviors have changed within the last four to five years, but they have been able to manage it. However, three to four weeks ago, they patient have been easily agitated and irritable. She's increasingly falling and argumentative. Family is concerned if he has had any changes medically.  During the interview, the patient was sedated and unable to participate in the assessment.  Family further reports, patient have in home aids on a daily basis. In home aids also noticed changes in her behaviors.  Past Medical History:  Past Medical History:  Diagnosis Date  . Asthma   . Bruises easily     Past Surgical History:  Procedure Laterality Date  . VESICOVAGINAL FISTULA CLOSURE W/ TAH      Family History:  Family History  Problem Relation Age of Onset  . High blood pressure Mother   . Cancer Father     Social History:  reports that she has quit smoking. She has never used smokeless tobacco. She reports that she does not drink alcohol or use drugs.  Additional Social History:  Alcohol / Drug Use Pain Medications: See PTA Prescriptions: See PTA Over the Counter: See PTA History of alcohol / drug use?: No history of alcohol / drug abuse Longest period of sobriety (when/how long): n/a  CIWA: CIWA-Ar BP: (!) 160/68 Pulse Rate: 82 COWS:    Allergies:  Allergies  Allergen Reactions  . Penicillins Rash    Home Medications: (Not in a hospital admission)   OB/GYN Status:  No LMP recorded.  General Assessment Data Location of Assessment: [facility] ED TTS Assessment: In system Is this a Tele or Face-to-Face Assessment?: Face-to-Face Is this an Initial Assessment or a Re-assessment for this encounter?: Initial Assessment Patient Accompanied by:: Other(Daughter) Language Other than English:  No Living Arrangements: Other (Comment)(Private Home) What gender do you identify as?: Female Marital status: Widowed Pregnancy Status: No Living Arrangements: Alone Can pt return to current living arrangement?: Yes Admission Status: Involuntary Petitioner: ED Attending Is patient capable of signing voluntary admission?: No(Under IVC) Referral Source: Self/Family/Friend Insurance type: Medicare A&B  Medical Screening Exam ([place]) Medical Exam completed: No Reason for MSE not completed: Other:(Getting CT-Scan)  Crisis Care Plan Living Arrangements: Alone Legal Guardian: Other:(Self) Name of Psychiatrist: Reports of none Name of Therapist: Reports of none  Education Status Is patient currently in school?: No Is the patient employed, unemployed or receiving disability?: Unemployed, Receiving disability income  Risk to self with the past 6 months Suicidal Ideation: No Has patient been a risk to self within the past 6 months prior to admission? : No Suicidal Intent: No Has patient had any suicidal intent within the past 6 months prior to admission? : No Is patient at risk for suicide?: No Suicidal Plan?: No Has patient had any suicidal plan within the past 6 months prior to admission? : No Access to Means: No What has been your use of drugs/alcohol within the last 12 months?: None reported Previous Attempts/Gestures: No How many times?: 0 Other Self Harm Risks: Reports of none Triggers for Past Attempts: None known Intentional Self Injurious Behavior: None Family Suicide History: No Recent stressful life event(s): Other (Comment) Persecutory voices/beliefs?: No Depression: No Depression Symptoms: Feeling angry/irritable Substance abuse history and/or treatment for substance abuse?: No Suicide prevention information given  to non-admitted patients: Not applicable  Risk to Others within the past 6 months Homicidal Ideation: No Does patient have any lifetime risk  of violence toward others beyond the six months prior to admission? : No Thoughts of Harm to Others: No Current Homicidal Intent: No Current Homicidal Plan: No Access to Homicidal Means: No Identified Victim: Reports of none History of harm to others?: No Assessment of Violence: None Noted Violent Behavior Description: None reported Does patient have access to weapons?: No Criminal Charges Pending?: No Does patient have a court date: No Is patient on probation?: No  Psychosis Hallucinations: None noted Delusions: None noted  Mental Status Report Appearance/Hygiene: Unremarkable, In hospital gown Eye Contact: Unable to Assess Motor Activity: Unable to assess Speech: Unable to assess Level of Consciousness: Unable to assess, Sedated Mood: (UTA) Affect: Unable to Assess Anxiety Level: (UTA) Thought Processes: Unable to Assess Judgement: Unable to Assess Orientation: Unable to assess Obsessive Compulsive Thoughts/Behaviors: Unable to Assess  Cognitive Functioning Concentration: Unable to Assess Memory: Unable to Assess Is patient IDD: No Insight: Unable to Assess Impulse Control: Unable to Assess Appetite: (UTA) Have you had any weight changes? : (UTA) Sleep: Unable to Assess Vegetative Symptoms: Unable to Assess  ADLScreening [facility] Assessment Services) Patient's cognitive ability adequate to safely complete daily activities?: Yes Patient able to express need for assistance with ADLs?: Yes Independently performs ADLs?: Yes (appropriate for developmental age)  Prior Inpatient Therapy Prior Inpatient Therapy: No  Prior Outpatient Therapy Prior Outpatient Therapy: No Does patient have an ACCT team?: No Does patient have Intensive In-House Services?  : No Does patient have [facility] services? : No Does patient have P4CC services?: No  ADL Screening (condition at time of admission) Patient's cognitive ability adequate to safely complete daily activities?: Yes Is the  patient deaf or have difficulty hearing?: No Does the patient have difficulty seeing, even when wearing glasses/contacts?: No Does the patient have difficulty concentrating, remembering, or making decisions?: No Patient able to express need for assistance with ADLs?: Yes Does the patient have difficulty dressing or bathing?: No Independently performs ADLs?: Yes (appropriate for developmental age) Does the patient have difficulty walking or climbing stairs?: No Weakness of Legs: None Weakness of Arms/Hands: None  Home Assistive Devices/Equipment Home Assistive Devices/Equipment: None  Therapy Consults (therapy consults require a physician order) PT Evaluation Needed: No OT Evalulation Needed: No SLP Evaluation Needed: No Abuse/Neglect Assessment (Assessment to be complete while patient is alone) Abuse/Neglect Assessment Can Be Completed: Yes Physical Abuse: Denies Verbal Abuse: Denies Sexual Abuse: Denies Exploitation of patient/patient's resources: Denies Self-Neglect: Denies Values / Beliefs Cultural Requests During Hospitalization: None Spiritual Requests During Hospitalization: None Consults Spiritual Care Consult Needed: No Transition of Care Team Consult Needed: No Advance Directives (For Healthcare) Does Patient Have a Medical Advance Directive?: Yes Type of Advance Directive: Out of facility DNR (pink MOST or yellow form)  Disposition:  Disposition Initial Assessment Completed for this Encounter: Yes  Per Psychiatric Nurse Practitioner, Malachy Chamber, patient doesn't need TTS Consult. Psych Consult will remain.    On Site Evaluation by:   Reviewed with Physician:    Lilyan Gilford MS, LCAS, [facility], N[facility] Therapeutic Triage Specialist 09/19/2019 6:16 PM

## 2019-09-19 NOTE — ED Notes (Signed)
Lab called to add on troponin 

## 2019-09-19 NOTE — NC FL2 (Signed)
  [place] MEDICAID FL2 LEVEL OF CARE SCREENING TOOL     IDENTIFICATION  Patient Name: Theresa Luna Birthdate: [birth year] Sex: female Admission Date (Current Location): 09/19/2019  [place] and [place] Number:  Chiropodist and Address:  [facility], [address] [place], [place] [address]      Provider Number: [id number]  Attending Physician Name and Address:  Arnaldo Natal, MD  Relative Name and Phone Number:  McPherson,Melissa (Daughter) [phone]    Current Level of Care: Hospital Recommended Level of Care: Skilled Nursing Facility Prior Approval Number:    Date Approved/Denied:   PASRR Number:    Discharge Plan: SNF    Current Diagnoses: There are no problems to display for this patient.   Orientation RESPIRATION BLADDER Height & Weight     Self, Place  Normal Continent Weight: 105 lb 13.1 oz (48 kg) Height:  5\' 1"  (154.9 cm)  BEHAVIORAL SYMPTOMS/MOOD NEUROLOGICAL BOWEL NUTRITION STATUS      Continent Diet  AMBULATORY STATUS COMMUNICATION OF NEEDS Skin   Extensive Assist Verbally Normal                       Personal Care Assistance Level of Assistance  Bathing, Feeding, Dressing, Total care Bathing Assistance: Maximum assistance Feeding assistance: Limited assistance Dressing Assistance: Maximum assistance Total Care Assistance: Maximum assistance   Functional Limitations Info             SPECIAL CARE FACTORS FREQUENCY                       Contractures Contractures Info: Not present    Additional Factors Info  Code Status Code Status Info: Unknown             Current Medications (09/19/2019):  This is the current hospital active medication list No current facility-administered medications for this encounter.   Current Outpatient Medications  Medication Sig Dispense Refill  . ADVAIR DISKUS 250-50 MCG/DOSE AEPB     . cephALEXin (KEFLEX) 500 MG capsule Take 1 capsule (500 mg total) by  mouth 2 (two) times daily. (Patient not taking: Reported on 09/19/2019) 14 capsule 0  . D 1000 1000 UNITS CHEW     . Fexofenadine HCl (ALLEGRA PO) Take by mouth daily.    . fluticasone (FLONASE) 50 MCG/ACT nasal spray     . montelukast (SINGULAIR) 10 MG tablet Take 10 mg by mouth at bedtime.    . moxifloxacin (AVELOX) 400 MG tablet     . nystatin (MYCOSTATIN) 100000 UNIT/ML suspension     . omeprazole (PRILOSEC) 40 MG capsule     . oxybutynin (DITROPAN-XL) 10 MG 24 hr tablet     . PULMICORT FLEXHALER 180 MCG/ACT inhaler     . SPIRIVA HANDIHALER 18 MCG inhalation capsule     . XOPENEX HFA 45 MCG/ACT inhaler        Discharge Medications: Please see discharge summary for a list of discharge medications.  Relevant Imaging Results:  Relevant Lab Results:   Additional Information SS# 11/19/2019  [id number], LCSW

## 2019-09-19 NOTE — ED Notes (Signed)
Daughter at bedside.

## 2019-09-20 DIAGNOSIS — D519 Vitamin B12 deficiency anemia, unspecified: Secondary | ICD-10-CM

## 2019-09-20 DIAGNOSIS — G9341 Metabolic encephalopathy: Secondary | ICD-10-CM | POA: Diagnosis present

## 2019-09-20 DIAGNOSIS — Z79899 Other long term (current) drug therapy: Secondary | ICD-10-CM | POA: Diagnosis not present

## 2019-09-20 DIAGNOSIS — J45909 Unspecified asthma, uncomplicated: Secondary | ICD-10-CM | POA: Diagnosis present

## 2019-09-20 DIAGNOSIS — R4182 Altered mental status, unspecified: Secondary | ICD-10-CM | POA: Diagnosis present

## 2019-09-20 DIAGNOSIS — L03116 Cellulitis of left lower limb: Secondary | ICD-10-CM

## 2019-09-20 DIAGNOSIS — F0151 Vascular dementia with behavioral disturbance: Secondary | ICD-10-CM | POA: Diagnosis not present

## 2019-09-20 DIAGNOSIS — N1832 Chronic kidney disease, stage 3b: Secondary | ICD-10-CM | POA: Diagnosis present

## 2019-09-20 DIAGNOSIS — F0150 Vascular dementia without behavioral disturbance: Secondary | ICD-10-CM

## 2019-09-20 DIAGNOSIS — S2242XA Multiple fractures of ribs, left side, initial encounter for closed fracture: Secondary | ICD-10-CM | POA: Diagnosis present

## 2019-09-20 DIAGNOSIS — L8990 Pressure ulcer of unspecified site, unspecified stage: Secondary | ICD-10-CM | POA: Insufficient documentation

## 2019-09-20 DIAGNOSIS — Z681 Body mass index (BMI) 19 or less, adult: Secondary | ICD-10-CM | POA: Diagnosis not present

## 2019-09-20 DIAGNOSIS — R410 Disorientation, unspecified: Secondary | ICD-10-CM | POA: Diagnosis not present

## 2019-09-20 DIAGNOSIS — Z20822 Contact with and (suspected) exposure to covid-19: Secondary | ICD-10-CM | POA: Diagnosis present

## 2019-09-20 DIAGNOSIS — F329 Major depressive disorder, single episode, unspecified: Secondary | ICD-10-CM | POA: Diagnosis present

## 2019-09-20 DIAGNOSIS — Z66 Do not resuscitate: Secondary | ICD-10-CM | POA: Diagnosis present

## 2019-09-20 DIAGNOSIS — I129 Hypertensive chronic kidney disease with stage 1 through stage 4 chronic kidney disease, or unspecified chronic kidney disease: Secondary | ICD-10-CM | POA: Diagnosis present

## 2019-09-20 DIAGNOSIS — F05 Delirium due to known physiological condition: Secondary | ICD-10-CM | POA: Diagnosis not present

## 2019-09-20 DIAGNOSIS — Z7951 Long term (current) use of inhaled steroids: Secondary | ICD-10-CM | POA: Diagnosis not present

## 2019-09-20 DIAGNOSIS — I872 Venous insufficiency (chronic) (peripheral): Secondary | ICD-10-CM | POA: Diagnosis present

## 2019-09-20 DIAGNOSIS — E860 Dehydration: Secondary | ICD-10-CM

## 2019-09-20 DIAGNOSIS — D513 Other dietary vitamin B12 deficiency anemia: Secondary | ICD-10-CM | POA: Diagnosis not present

## 2019-09-20 DIAGNOSIS — W19XXXA Unspecified fall, initial encounter: Secondary | ICD-10-CM | POA: Diagnosis present

## 2019-09-20 DIAGNOSIS — Z8249 Family history of ischemic heart disease and other diseases of the circulatory system: Secondary | ICD-10-CM | POA: Diagnosis not present

## 2019-09-20 DIAGNOSIS — Z880 Allergy status to penicillin: Secondary | ICD-10-CM | POA: Diagnosis not present

## 2019-09-20 DIAGNOSIS — Z87891 Personal history of nicotine dependence: Secondary | ICD-10-CM | POA: Diagnosis not present

## 2019-09-20 DIAGNOSIS — R64 Cachexia: Secondary | ICD-10-CM | POA: Diagnosis present

## 2019-09-20 DIAGNOSIS — R296 Repeated falls: Secondary | ICD-10-CM | POA: Diagnosis present

## 2019-09-20 DIAGNOSIS — Z888 Allergy status to other drugs, medicaments and biological substances status: Secondary | ICD-10-CM | POA: Diagnosis not present

## 2019-09-20 DIAGNOSIS — R630 Anorexia: Secondary | ICD-10-CM | POA: Diagnosis present

## 2019-09-20 LAB — MAGNESIUM: Magnesium: 2 mg/dL (ref 1.7–2.4)

## 2019-09-20 LAB — CBC WITH DIFFERENTIAL/PLATELET
Abs Immature Granulocytes: 0.1 10*3/uL — ABNORMAL HIGH (ref 0.00–0.07)
Basophils Absolute: 0.1 10*3/uL (ref 0.0–0.1)
Basophils Relative: 0 %
Eosinophils Absolute: 0.1 10*3/uL (ref 0.0–0.5)
Eosinophils Relative: 1 %
HCT: 36.1 % (ref 36.0–46.0)
Hemoglobin: 10.8 g/dL — ABNORMAL LOW (ref 12.0–15.0)
Immature Granulocytes: 1 %
Lymphocytes Relative: 22 %
Lymphs Abs: 2.7 10*3/uL (ref 0.7–4.0)
MCH: 26.3 pg (ref 26.0–34.0)
MCHC: 29.9 g/dL — ABNORMAL LOW (ref 30.0–36.0)
MCV: 87.8 fL (ref 80.0–100.0)
Monocytes Absolute: 0.9 10*3/uL (ref 0.1–1.0)
Monocytes Relative: 7 %
Neutro Abs: 8.3 10*3/uL — ABNORMAL HIGH (ref 1.7–7.7)
Neutrophils Relative %: 69 %
Platelets: 246 10*3/uL (ref 150–400)
RBC: 4.11 MIL/uL (ref 3.87–5.11)
RDW: 17.1 % — ABNORMAL HIGH (ref 11.5–15.5)
WBC: 12.1 10*3/uL — ABNORMAL HIGH (ref 4.0–10.5)
nRBC: 0 % (ref 0.0–0.2)

## 2019-09-20 LAB — COMPREHENSIVE METABOLIC PANEL
ALT: 11 U/L (ref 0–44)
AST: 17 U/L (ref 15–41)
Albumin: 3.5 g/dL (ref 3.5–5.0)
Alkaline Phosphatase: 52 U/L (ref 38–126)
Anion gap: 10 (ref 5–15)
BUN: 25 mg/dL — ABNORMAL HIGH (ref 8–23)
CO2: 29 mmol/L (ref 22–32)
Calcium: 9.1 mg/dL (ref 8.9–10.3)
Chloride: 103 mmol/L (ref 98–111)
Creatinine, Ser: 0.9 mg/dL (ref 0.44–1.00)
GFR calc Af Amer: >60 mL/min (ref >60)
GFR calc non Af Amer: 53 mL/min — ABNORMAL LOW (ref >60)
Glucose, Bld: 86 mg/dL (ref 70–99)
Potassium: 4 mmol/L (ref 3.5–5.1)
Sodium: 142 mmol/L (ref 135–145)
Total Bilirubin: 0.7 mg/dL (ref 0.3–1.2)
Total Protein: 5.9 g/dL — ABNORMAL LOW (ref 6.5–8.1)

## 2019-09-20 LAB — URINALYSIS, COMPLETE (UACMP) WITH MICROSCOPIC
Bilirubin Urine: NEGATIVE
Glucose, UA: NEGATIVE mg/dL
Hgb urine dipstick: NEGATIVE
Ketones, ur: NEGATIVE mg/dL
Nitrite: NEGATIVE
Protein, ur: NEGATIVE mg/dL
Specific Gravity, Urine: 1.008 (ref 1.005–1.030)
pH: 7 (ref 5.0–8.0)

## 2019-09-20 LAB — URINE DRUG SCREEN, QUALITATIVE (ARMC ONLY)
Amphetamines, Ur Screen: NOT DETECTED
Barbiturates, Ur Screen: NOT DETECTED
Benzodiazepine, Ur Scrn: POSITIVE — AB
Cannabinoid 50 Ng, Ur ~~LOC~~: NOT DETECTED
Cocaine Metabolite,Ur ~~LOC~~: NOT DETECTED
MDMA (Ecstasy)Ur Screen: NOT DETECTED
Methadone Scn, Ur: NOT DETECTED
Opiate, Ur Screen: NOT DETECTED
Phencyclidine (PCP) Ur S: NOT DETECTED
Tricyclic, Ur Screen: NOT DETECTED

## 2019-09-20 LAB — TSH: TSH: 3.158 u[IU]/mL (ref 0.350–4.500)

## 2019-09-20 LAB — SARS CORONAVIRUS 2 BY RT PCR (HOSPITAL ORDER, PERFORMED IN ~~LOC~~ HOSPITAL LAB): SARS Coronavirus 2: NEGATIVE

## 2019-09-20 LAB — VITAMIN B12: Vitamin B-12: 166 pg/mL — ABNORMAL LOW (ref 180–914)

## 2019-09-20 MED ORDER — ONDANSETRON HCL 4 MG PO TABS: 4.0000 mg | ORAL_TABLET | Freq: Four times a day (QID) | ORAL | Status: DC | PRN

## 2019-09-20 MED ORDER — VANCOMYCIN HCL 1250 MG/250ML IV SOLN
1250.0000 mg | Freq: Once | INTRAVENOUS | Status: AC
  Administered 2019-09-20: 1250 mg via INTRAVENOUS
  Filled 2019-09-20: qty 250

## 2019-09-20 MED ORDER — HALOPERIDOL LACTATE 5 MG/ML IJ SOLN: 1.0000 mg | Freq: Four times a day (QID) | INTRAMUSCULAR | Status: DC | PRN

## 2019-09-20 MED ORDER — ACETAMINOPHEN 325 MG PO TABS: 650.0000 mg | ORAL_TABLET | Freq: Four times a day (QID) | ORAL | Status: DC | PRN

## 2019-09-20 MED ORDER — SODIUM CHLORIDE 0.9 % IV SOLN: Freq: Once | INTRAVENOUS | Status: DC

## 2019-09-20 MED ORDER — ACETAMINOPHEN 650 MG RE SUPP: 650.0000 mg | Freq: Four times a day (QID) | RECTAL | Status: DC | PRN

## 2019-09-20 MED ORDER — ONDANSETRON HCL 4 MG/2ML IJ SOLN: 4.0000 mg | Freq: Four times a day (QID) | INTRAMUSCULAR | Status: DC | PRN

## 2019-09-20 MED ORDER — VANCOMYCIN HCL 750 MG/150ML IV SOLN
750.0000 mg | INTRAVENOUS | Status: DC
  Administered 2019-09-21: 18:00:00 750 mg via INTRAVENOUS
  Filled 2019-09-20 (×2): qty 150

## 2019-09-20 MED ORDER — HYDROCODONE-ACETAMINOPHEN 5-325 MG PO TABS
1.0000 | ORAL_TABLET | ORAL | Status: DC | PRN
  Administered 2019-09-20: 1 via ORAL
  Administered 2019-09-22: 2 via ORAL
  Filled 2019-09-20: qty 1
  Filled 2019-09-20: qty 2

## 2019-09-20 MED ORDER — POLYETHYLENE GLYCOL 3350 17 G PO PACK: 17.0000 g | PACK | Freq: Every day | ORAL | Status: DC | PRN

## 2019-09-20 MED ORDER — CYANOCOBALAMIN 1000 MCG/ML IJ SOLN
1000.0000 ug | Freq: Once | INTRAMUSCULAR | Status: DC
  Filled 2019-09-20: qty 1

## 2019-09-20 MED ORDER — HEPARIN SODIUM (PORCINE) 5000 UNIT/ML IJ SOLN
5000.0000 [IU] | Freq: Three times a day (TID) | INTRAMUSCULAR | Status: DC
  Administered 2019-09-20 – 2019-09-22 (×5): 5000 [IU] via SUBCUTANEOUS
  Filled 2019-09-20 (×5): qty 1

## 2019-09-20 MED ORDER — QUETIAPINE FUMARATE 25 MG PO TABS
25.0000 mg | ORAL_TABLET | Freq: Every day | ORAL | Status: DC
  Administered 2019-09-20: 20:00:00 25 mg via ORAL
  Filled 2019-09-20: qty 1

## 2019-09-20 NOTE — Plan of Care (Signed)

## 2019-09-20 NOTE — Consult Note (Signed)
[facility] Face-to-Face Psychiatry Consult   Reason for Consult:  " Patient with rapid decrease in mental status is confused and combative I am committing her to need her evaluated for competency" Referring Physician: Alden Hipp  Patient Identification: Theresa Luna MRN:  [id number] Principal Diagnosis: <principal problem not specified> Diagnosis:  Active Problems:   Altered mental status   Pressure injury of skin   Total Time spent with patient: 15 minutes  Subjective:   Theresa Luna is a 86 y.o. female was seen and evaluated by nurse practitioner TTS counselor. She is awake alert and oriented to self only. Patient reports " I am feeling awful all over."  attempted to assess patient however appears to be sedated and confused throughout assessment. Patient reported " I need to talk with my mom." Chart reviewed patient followed by palliative care and noted in her history with vascular dementia.  CT results pending. TTS to provide additional collateral from family. We will continue psychiatric consult when patient medically cleared and/or able to participate in assessment. Case staffed with attending psychiatrist Lucianne Muss. Support,encouragement and reassurance was provided.  HPI:  Theresa Luna  is a 86 y.o. female, with history of bruises easily and asthma, presents to the ER with a chief complaint of fall.  Patient is confused, and apparently this has been a problem for the last 3-4 weeks - and is not associated with this fall. Due to this, patient is not able to provide history.  Daughter is at bedside and reports that the facility that her mother lives that called her to report fall and that patient was coming into the ER.  She does not have any further information about the incident.  Daughter is very concerned because mother has been declining over the last 3 to 4 weeks becoming more confused, irritable, and agitated.  Patient does have a history of vascular dementia.  She follows with palliative care and  geriatrics at [facility].  Palliative care note patient was able to endorse that she is having trouble with her appetite and having trouble with her memory.  Palliative care has also discussed with her using her walker to prevent falls.  Daughter reports that patient has had 4 falls since Easter.  Daughter is also frustrated with mother is behavioral disturbance.  At the last appointment with the PCP they reported that patient has very little insight into safety issues, does not like her senior housing food and therefore has poor p.o. intake. PCP and daughter have spoken about sending her to a SNF, but patient has been resistant.    Past Psychiatric History:   Risk to Self: Suicidal Ideation: No Suicidal Intent: No Is patient at risk for suicide?: No Suicidal Plan?: No Access to Means: No What has been your use of drugs/alcohol within the last 12 months?: None reported How many times?: 0 Other Self Harm Risks: Reports of none Triggers for Past Attempts: None known Intentional Self Injurious Behavior: None Risk to Others: Homicidal Ideation: No Thoughts of Harm to Others: No Current Homicidal Intent: No Current Homicidal Plan: No Access to Homicidal Means: No Identified Victim: Reports of none History of harm to others?: No Assessment of Violence: None Noted Violent Behavior Description: None reported Does patient have access to weapons?: No Criminal Charges Pending?: No Does patient have a court date: No Prior Inpatient Therapy: Prior Inpatient Therapy: No Prior Outpatient Therapy: Prior Outpatient Therapy: No Does patient have an ACCT team?: No Does patient have Intensive In-House Services?  : No Does  patient have [facility] services? : No Does patient have P4CC services?: No  Past Medical History:  Past Medical History:  Diagnosis Date  . Asthma   . Bruises easily     Past Surgical History:  Procedure Laterality Date  . VESICOVAGINAL FISTULA CLOSURE W/ TAH     Family History:   Family History  Problem Relation Age of Onset  . High blood pressure Mother   . Cancer Father    Family Psychiatric  History:  Social History:  Social History   Substance and Sexual Activity  Alcohol Use No     Social History   Substance and Sexual Activity  Drug Use No    Social History   Socioeconomic History  . Marital status: Widowed    Spouse name: Not on file  . Number of children: Not on file  . Years of education: Not on file  . Highest education level: Not on file  Occupational History  . Not on file  Tobacco Use  . Smoking status: Former Research scientist (life sciences)  . Smokeless tobacco: Never Used  Substance and Sexual Activity  . Alcohol use: No  . Drug use: No  . Sexual activity: Not on file  Other Topics Concern  . Not on file  Social History Narrative  . Not on file   Social Determinants of Health   Financial Resource Strain:   . Difficulty of Paying Living Expenses:   Food Insecurity:   . Worried About Charity fundraiser in the Last Year:   . Arboriculturist in the Last Year:   Transportation Needs:   . Film/video editor (Medical):   Marland Kitchen Lack of Transportation (Non-Medical):   Physical Activity:   . Days of Exercise per Week:   . Minutes of Exercise per Session:   Stress:   . Feeling of Stress :   Social Connections:   . Frequency of Communication with Friends and Family:   . Frequency of Social Gatherings with Friends and Family:   . Attends Religious Services:   . Active Member of Clubs or Organizations:   . Attends Archivist Meetings:   Marland Kitchen Marital Status:    Additional Social History:    Allergies:   Allergies  Allergen Reactions  . Mirtazapine     Hallucinations  . Penicillins Rash    Labs:  Results for orders placed or performed during the hospital encounter of 09/19/19 (from the past 48 hour(s))  Comprehensive metabolic panel     Status: Abnormal   Collection Time: 09/19/19  2:46 PM  Result Value Ref Range   Sodium 140 135  - 145 mmol/L   Potassium 4.1 3.5 - 5.1 mmol/L   Chloride 103 98 - 111 mmol/L   CO2 29 22 - 32 mmol/L   Glucose, Bld 125 (H) 70 - 99 mg/dL    Comment: Glucose reference range applies only to samples taken after fasting for at least 8 hours.   BUN 33 (H) 8 - 23 mg/dL   Creatinine, Ser 1.09 (H) 0.44 - 1.00 mg/dL   Calcium 8.8 (L) 8.9 - 10.3 mg/dL   Total Protein 5.9 (L) 6.5 - 8.1 g/dL   Albumin 3.7 3.5 - 5.0 g/dL   AST 14 (L) 15 - 41 U/L   ALT 13 0 - 44 U/L   Alkaline Phosphatase 53 38 - 126 U/L   Total Bilirubin 0.7 0.3 - 1.2 mg/dL   GFR calc non Af Amer 42 (L) >60 mL/min  GFR calc Af Amer 49 (L) >60 mL/min   Anion gap 8 5 - 15    Comment: Performed at [facility], [address] [place], [place] [address]  CBC     Status: Abnormal   Collection Time: 09/19/19  2:46 PM  Result Value Ref Range   WBC 12.7 (H) 4.0 - 10.5 K/uL   RBC 3.82 (L) 3.87 - 5.11 MIL/uL   Hemoglobin 10.0 (L) 12.0 - 15.0 g/dL   HCT 52.8 (L) 41.3 - 24.4 %   MCV 87.7 80.0 - 100.0 fL   MCH 26.2 26.0 - 34.0 pg   MCHC 29.9 (L) 30.0 - 36.0 g/dL   RDW 01.0 (H) 27.2 - 53.6 %   Platelets 220 150 - 400 K/uL   nRBC 0.0 0.0 - 0.2 %    Comment: Performed at [facility], [address] [place], [place] [address]  Protime-INR - (order if patient is taking Coumadin / Warfarin)     Status: None   Collection Time: 09/19/19  2:46 PM  Result Value Ref Range   Prothrombin Time 12.3 11.4 - 15.2 seconds   INR 1.0 0.8 - 1.2    Comment: (NOTE) INR goal varies based on device and disease states. Performed at [facility], [address] [place], [place] [address]   Troponin I (High Sensitivity)     Status: None   Collection Time: 09/19/19  2:46 PM  Result Value Ref Range   Troponin I (High Sensitivity) 9 <18 ng/L    Comment: (NOTE) Elevated high sensitivity troponin I (hsTnI) values and significant  changes across serial measurements may suggest ACS but many other  chronic and acute  conditions are known to elevate hsTnI results.  Refer to the "Links" section for chest pain algorithms and additional  guidance. Performed at [facility], [address] [place], [place] [address]   CBC with Differential/Platelet     Status: Abnormal   Collection Time: 09/19/19  2:46 PM  Result Value Ref Range   WBC 12.8 (H) 4.0 - 10.5 K/uL   RBC 3.83 (L) 3.87 - 5.11 MIL/uL   Hemoglobin 10.0 (L) 12.0 - 15.0 g/dL   HCT 75.6 (L) 43.3 - 29.5 %   MCV 88.0 80.0 - 100.0 fL   MCH 26.1 26.0 - 34.0 pg   MCHC 29.7 (L) 30.0 - 36.0 g/dL   RDW 18.8 (H) 41.6 - 60.6 %   Platelets 225 150 - 400 K/uL   nRBC 0.0 0.0 - 0.2 %   Neutrophils Relative % 92 %   Neutro Abs 11.7 (H) 1.7 - 7.7 K/uL   Lymphocytes Relative 5 %   Lymphs Abs 0.7 0.7 - 4.0 K/uL   Monocytes Relative 2 %   Monocytes Absolute 0.3 0.1 - 1.0 K/uL   Eosinophils Relative 0 %   Eosinophils Absolute 0.0 0.0 - 0.5 K/uL   Basophils Relative 0 %   Basophils Absolute 0.0 0.0 - 0.1 K/uL   Immature Granulocytes 1 %   Abs Immature Granulocytes 0.11 (H) 0.00 - 0.07 K/uL    Comment: Performed at [facility], [address] [place], [place] [address]  Ethanol     Status: None   Collection Time: 09/19/19  5:20 PM  Result Value Ref Range   Alcohol, Ethyl (B) <10 <10 mg/dL    Comment: (NOTE) Lowest detectable limit for serum alcohol is 10 mg/dL. For medical purposes only. Performed at [facility], [address]  [place], [place] [address]   Lactic acid, plasma     Status: None   Collection Time: 09/19/19  5:20 PM  Result Value Ref Range   Lactic Acid, Venous 1.0 0.5 - 1.9 mmol/L    Comment: Performed at [facility], [address] [place], [place] [address]  Troponin I (High Sensitivity)     Status: None   Collection Time: 09/19/19  5:20 PM  Result Value Ref Range   Troponin I (High Sensitivity) 11 <18 ng/L    Comment: (NOTE) Elevated high sensitivity troponin I (hsTnI) values and  significant  changes across serial measurements may suggest ACS but many other  chronic and acute conditions are known to elevate hsTnI results.  Refer to the "Links" section for chest pain algorithms and additional  guidance. Performed at [facility], [address] [place], [place] [address]   Urinalysis, Complete w Microscopic     Status: Abnormal   Collection Time: 09/19/19 10:35 PM  Result Value Ref Range   Color, Urine STRAW (A) YELLOW   APPearance CLEAR (A) CLEAR   Specific Gravity, Urine 1.008 1.005 - 1.030   pH 7.0 5.0 - 8.0   Glucose, UA NEGATIVE NEGATIVE mg/dL   Hgb urine dipstick NEGATIVE NEGATIVE   Bilirubin Urine NEGATIVE NEGATIVE   Ketones, ur NEGATIVE NEGATIVE mg/dL   Protein, ur NEGATIVE NEGATIVE mg/dL   Nitrite NEGATIVE NEGATIVE   Leukocytes,Ua TRACE (A) NEGATIVE   WBC, UA 0-5 0 - 5 WBC/hpf   Bacteria, UA RARE (A) NONE SEEN   Squamous Epithelial / LPF 0-5 0 - 5    Comment: Performed at [facility], [address] [place], [place] [address]  Urine Drug Screen, Qualitative     Status: Abnormal   Collection Time: 09/19/19 10:35 PM  Result Value Ref Range   Tricyclic, Ur Screen NONE DETECTED NONE DETECTED   Amphetamines, Ur Screen NONE DETECTED NONE DETECTED   MDMA (Ecstasy)Ur Screen NONE DETECTED NONE DETECTED   Cocaine Metabolite,Ur [place] NONE DETECTED NONE DETECTED   Opiate, Ur Screen NONE DETECTED NONE DETECTED   Phencyclidine (PCP) Ur S NONE DETECTED NONE DETECTED   Cannabinoid 50 Ng, Ur [place] NONE DETECTED NONE DETECTED   Barbiturates, Ur Screen NONE DETECTED NONE DETECTED   Benzodiazepine, Ur Scrn POSITIVE (A) NONE DETECTED   Methadone Scn, Ur NONE DETECTED NONE DETECTED    Comment: (NOTE) Tricyclics + metabolites, urine    Cutoff 1000 ng/mL Amphetamines + metabolites, urine  Cutoff 1000 ng/mL MDMA (Ecstasy), urine              Cutoff 500 ng/mL Cocaine Metabolite, urine          Cutoff 300 ng/mL Opiate + metabolites, urine         Cutoff 300 ng/mL Phencyclidine (PCP), urine         Cutoff 25 ng/mL Cannabinoid, urine                 Cutoff 50 ng/mL Barbiturates + metabolites, urine  Cutoff 200 ng/mL Benzodiazepine, urine              Cutoff 200 ng/mL Methadone, urine                   Cutoff 300 ng/mL The urine drug screen provides only a preliminary, unconfirmed analytical test result and should not be used for non-medical purposes. Clinical consideration and professional judgment should be applied to any positive drug screen result due to possible interfering  substances. A more specific alternate chemical method must be used in order to obtain a confirmed analytical result. Gas chromatography / mass spectrometry (GC/MS) is the preferred confirmat ory method. Performed at [facility], [address] [place], [place] [address]   Vitamin B12     Status: Abnormal   Collection Time: 09/19/19 10:35 PM  Result Value Ref Range   Vitamin B-12 166 (L) 180 - 914 pg/mL    Comment: (NOTE) This assay is not validated for testing neonatal or myeloproliferative syndrome specimens for Vitamin B12 levels. Performed at [facility], [address] [place], [place] [address]   TSH     Status: None   Collection Time: 09/19/19 10:35 PM  Result Value Ref Range   TSH 3.158 0.350 - 4.500 uIU/mL    Comment: Performed by a 3rd Generation assay with a functional sensitivity of <=0.01 uIU/mL. Performed at [facility], [address] [place], [place] [address]   SARS Coronavirus 2 by RT PCR (hospital order, performed in [facility] hospital lab) Nasopharyngeal Nasopharyngeal Swab     Status: None   Collection Time: 09/20/19 12:54 AM   Specimen: Nasopharyngeal Swab  Result Value Ref Range   SARS Coronavirus 2 NEGATIVE NEGATIVE    Comment: (NOTE) SARS-CoV-2 target nucleic acids are NOT DETECTED. The SARS-CoV-2 RNA is generally detectable in upper and lower respiratory specimens during the acute phase  of infection. The lowest concentration of SARS-CoV-2 viral copies this assay can detect is 250 copies / mL. A negative result does not preclude SARS-CoV-2 infection and should not be used as the sole basis for treatment or other patient management decisions.  A negative result may occur with improper specimen collection / handling, submission of specimen other than nasopharyngeal swab, presence of viral mutation(s) within the areas targeted by this assay, and inadequate number of viral copies (<250 copies / mL). A negative result must be combined with clinical observations, patient history, and epidemiological information. Fact Sheet for Patients:   BoilerBrush.com.cyhttps://www.fda.gov/media/136312/download Fact Sheet for Healthcare Providers: https://pope.com/https://www.fda.gov/media/136313/download This test is not yet approved or cleared  by the [place] FDA and has been authorized for detection and/or diagnosis of SARS-CoV-2 by FDA under an Emergency Use Authorization (EUA).  This EUA will remain in effect (meaning this test can be used) for the duration of the COVID-19 declaration under Section 564(b)(1) of the Act, 21 U.S.C. section 360bbb-3(b)(1), unless the authorization is terminated or revoked sooner. Performed at [facility], [address] [place], [place] [address]   Comprehensive metabolic panel     Status: Abnormal   Collection Time: 09/20/19  4:40 AM  Result Value Ref Range   Sodium 142 135 - 145 mmol/L   Potassium 4.0 3.5 - 5.1 mmol/L   Chloride 103 98 - 111 mmol/L   CO2 29 22 - 32 mmol/L   Glucose, Bld 86 70 - 99 mg/dL    Comment: Glucose reference range applies only to samples taken after fasting for at least 8 hours.   BUN 25 (H) 8 - 23 mg/dL   Creatinine, Ser 2.950.90 0.44 - 1.00 mg/dL   Calcium 9.1 8.9 - 62.110.3 mg/dL   Total Protein 5.9 (L) 6.5 - 8.1 g/dL   Albumin 3.5 3.5 - 5.0 g/dL   AST 17 15 - 41 U/L   ALT 11 0 - 44 U/L   Alkaline Phosphatase 52 38 - 126 U/L   Total  Bilirubin 0.7 0.3 - 1.2 mg/dL  GFR calc non Af Amer 53 (L) >60 mL/min   GFR calc Af Amer >60 >60 mL/min   Anion gap 10 5 - 15    Comment: Performed at [facility], [address] [place], [place] [address]  Magnesium     Status: None   Collection Time: 09/20/19  4:40 AM  Result Value Ref Range   Magnesium 2.0 1.7 - 2.4 mg/dL    Comment: Performed at [facility], [address] [place], [place] [address]  CBC WITH DIFFERENTIAL     Status: Abnormal   Collection Time: 09/20/19  4:40 AM  Result Value Ref Range   WBC 12.1 (H) 4.0 - 10.5 K/uL   RBC 4.11 3.87 - 5.11 MIL/uL   Hemoglobin 10.8 (L) 12.0 - 15.0 g/dL   HCT 82.9 56.2 - 13.0 %   MCV 87.8 80.0 - 100.0 fL   MCH 26.3 26.0 - 34.0 pg   MCHC 29.9 (L) 30.0 - 36.0 g/dL   RDW 86.5 (H) 78.4 - 69.6 %   Platelets 246 150 - 400 K/uL   nRBC 0.0 0.0 - 0.2 %   Neutrophils Relative % 69 %   Neutro Abs 8.3 (H) 1.7 - 7.7 K/uL   Lymphocytes Relative 22 %   Lymphs Abs 2.7 0.7 - 4.0 K/uL   Monocytes Relative 7 %   Monocytes Absolute 0.9 0.1 - 1.0 K/uL   Eosinophils Relative 1 %   Eosinophils Absolute 0.1 0.0 - 0.5 K/uL   Basophils Relative 0 %   Basophils Absolute 0.1 0.0 - 0.1 K/uL   Immature Granulocytes 1 %   Abs Immature Granulocytes 0.10 (H) 0.00 - 0.07 K/uL    Comment: Performed at [facility], [address], [place], [place] [address]    Current Facility-Administered Medications  Medication Dose Route Frequency Provider Last Rate Last Admin  . 0.9 %  sodium chloride infusion   Intravenous Once Zierle-Ghosh, Asia B, DO 75 mL/hr at 09/20/19 0124 Rate Change at 09/20/19 0124  . acetaminophen (TYLENOL) tablet 650 mg  650 mg Oral Q6H PRN Zierle-Ghosh, Asia B, DO       Or  . acetaminophen (TYLENOL) suppository 650 mg  650 mg Rectal Q6H PRN Zierle-Ghosh, Asia B, DO      . acetaminophen (TYLENOL) tablet 650 mg  650 mg Oral BID Zierle-Ghosh, Asia B, DO      . albuterol (PROVENTIL) (2.5 MG/3ML) 0.083%  nebulizer solution 2.5 mg  2.5 mg Nebulization Q4H PRN Zierle-Ghosh, Asia B, DO      . heparin injection 5,000 Units  5,000 Units Subcutaneous Q8H Zierle-Ghosh, Asia B, DO   5,000 Units at 09/20/19 0556  . HYDROcodone-acetaminophen (NORCO/VICODIN) 5-325 MG per tablet 1-2 tablet  1-2 tablet Oral Q4H PRN Zierle-Ghosh, Asia B, DO      . montelukast (SINGULAIR) tablet 10 mg  10 mg Oral QHS Zierle-Ghosh, Asia B, DO      . ondansetron (ZOFRAN) tablet 4 mg  4 mg Oral Q6H PRN Zierle-Ghosh, Asia B, DO       Or  . ondansetron (ZOFRAN) injection 4 mg  4 mg Intravenous Q6H PRN Zierle-Ghosh, Asia B, DO      . pantoprazole (PROTONIX) EC tablet 40 mg  40 mg Oral Daily Zierle-Ghosh, Asia B, DO      . polyethylene glycol (MIRALAX / GLYCOLAX) packet 17 g  17 g Oral Daily PRN Zierle-Ghosh, Asia B, DO      . predniSONE (DELTASONE) tablet 10 mg  10 mg Oral q morning - 10a Zierle-Ghosh, Asia B, DO      . psyllium (HYDROCIL/METAMUCIL) packet 1 packet  1 packet Oral Daily Zierle-Ghosh, Asia B, DO      . [START ON 09/21/2019] vancomycin (VANCOREADY) IVPB 750 mg/150 mL  750 mg Intravenous Q36H Zierle-Ghosh, Asia B, DO        Musculoskeletal: Strength & Muscle Tone: within normal limits Gait & Station: Observed resting in bed Patient leans: N/A  Psychiatric Specialty Exam: Physical Exam  Review of Systems  Blood pressure (!) 170/68, pulse 78, temperature 97.6 F (36.4 C), temperature source Oral, resp. rate 20, height 5\' 1"  (1.549 m), weight 48 kg, SpO2 97 %.Body mass index is 19.99 kg/m.  General Appearance: [facility] gown  Eye Contact:  Minimal  Speech:  Clear and Coherent  Volume:  Normal  Mood:  Anxious and Irritable  Affect:  Congruent  Thought Process:  Coherent  Orientation:  Other:  Person only  Thought Content:  Hallucinations: denied   Suicidal Thoughts:  No  Homicidal Thoughts:  No  Memory:  Immediate;   Poor Recent;   Poor  Judgement:  Poor  Insight:  Lacking  Psychomotor Activity:  Normal   Concentration:  Concentration: Poor  Recall:  Poor " I don't know"   Fund of Knowledge:  Fair  Language:  Fair  Akathisia:  No  Handed:  Right  AIMS (if indicated):     Assets:  Communication Skills Desire for Improvement Resilience Social Support  ADL's:  Intact  Cognition:  WNL  Sleep:        Treatment Plan Summary: Daily contact with patient to assess and evaluate symptoms and progress in treatment and Medication management  -At this time patient does not appear to have decision-making capacity - Consider restart consulting psychiatry once medically cleared  CSW to follow-up with family for additional collateral  [facility], NP 09/20/2019 9:22 AM

## 2019-09-20 NOTE — Progress Notes (Signed)
Pharmacy Antibiotic Note  Theresa Luna is a 86 y.o. female admitted on 09/19/2019 with cellulitis.  Pharmacy has been consulted for vancomycin dosing.  Plan: Patient received vanc 1.25g IV load x 1 in ED  Will continue vanc 750 mg IV q36h and will monitor renal function via CMP/BMP w/ am labs. Patient also has a h/o CKD currently AKI on CKD, with no long term plans for being on vancomycin; therefore will just monitor renal function for now and adjust dose per changes in renal function.  Ke 0.02244 T1/2 30.9 ~ 36 hours  Height: 5\' 1"  (154.9 cm) Weight: 48 kg (105 lb 13.1 oz) IBW/kg (Calculated) : 47.8  Temp (24hrs), Avg:97.6 F (36.4 C), Min:97.6 F (36.4 C), Max:97.6 F (36.4 C)  Recent Labs  Lab 09/19/19 1446 09/19/19 1720  WBC 12.8*  12.7*  --   CREATININE 1.09*  --   LATICACIDVEN  --  1.0    Estimated Creatinine Clearance: 21.7 mL/min (A) (by C-G formula based on SCr of 1.09 mg/dL (H)).    Allergies  Allergen Reactions  . Mirtazapine     Hallucinations  . Penicillins Rash    Thank you for allowing pharmacy to be a part of this patient's care.  11/19/19, PharmD, BCPS Clinical Pharmacist 09/20/2019 5:09 AM

## 2019-09-20 NOTE — Plan of Care (Signed)
  Problem: Health Behavior/Discharge Planning: Goal: Ability to manage health-related needs will improve Outcome: Progressing   

## 2019-09-20 NOTE — ED Notes (Signed)
Pt eating sandwich at this time.

## 2019-09-20 NOTE — ED Notes (Signed)
Pt with wound to right lower calf at this time. Pt wound was cleaned, and redressed at this time.

## 2019-09-20 NOTE — TOC Progression Note (Signed)
Transition of Care [facility]) - Progression Note    Patient Details  Name: Theresa Luna MRN: [id number] Date of Birth: February 06, 1919  [facility]) CM/SW Contact  Eilleen Kempf, LCSW Phone Number: 09/20/2019, 4:59 PM  Clinical Narrative:    Daughter requested consult with CSW today. Pt's daughter is an only child and experiencing tremendous difficulty processing the change in her Mom's current condition. She is also struggling with having to make these decisions. Melissa (Pt's daughter) seems to have a strong understanding now of the situation and what options are available. She has agreed to a skilled nursing placement, she stated having a preference for [facility], I also sent to [place]. Money doesn't seem to be an issue and the Pt has some beginning memory issues. Efraim Kaufmann can be reached at [phone]  and plans to be here tomorrow after 10:00am.    Expected Discharge Plan and Services                                                 Social Determinants of Health (SDOH) Interventions    Readmission Risk Interventions No flowsheet data found.

## 2019-09-20 NOTE — H&P (Signed)
TRH H&P    Patient Demographics:    Theresa Luna, is a 86 y.o. female  MRN: [id number]  DOB - 1918-10-24  Admit Date - 09/19/2019  Referring MD/NP/PA: Dr. Darnelle Catalan  Outpatient Primary MD for the patient is Mittie Bodo Young Berry, MD  Patient coming from: [facility]isted living facility  Chief complaint- Fall   HPI:    Theresa Luna  is a 86 y.o. female, with history of bruises easily and asthma, presents to the ER with a chief complaint of fall.  Patient is confused, and apparently this has been a problem for the last 3-4 weeks - and is not associated with this fall. Due to this, patient is not able to provide history.  Daughter is at bedside and reports that the facility that her mother lives that called her to report fall and that patient was coming into the ER.  She does not have any further information about the incident.  Daughter is very concerned because mother has been declining over the last 3 to 4 weeks becoming more confused, irritable, and agitated.  Patient does have a history of vascular dementia.  She follows with palliative care and geriatrics at [facility].  Palliative care note patient was able to endorse that she is having trouble with her appetite and having trouble with her memory.  Palliative care has also discussed with her using her walker to prevent falls.  Daughter reports that patient has had 4 falls since Easter.  Daughter is also frustrated with mother is behavioral disturbance.  At the last appointment with the PCP they reported that patient has very little insight into safety issues, does not like her senior housing food and therefore has poor p.o. intake. PCP and daughter have spoken about sending her to a SNF, but patient has been resistant.   Per PCP note, patient has been battling depression - stating that she is ready to die, and doesn't want any more fuss. With this in mind - UDS  pending    Review of systems:  Patient is unable to provide ROS 2/2 dementia.  All other systems reviewed and are negative.    Past History of the following :    Past Medical History:  Diagnosis Date  . Asthma   . Bruises easily       Past Surgical History:  Procedure Laterality Date  . VESICOVAGINAL FISTULA CLOSURE W/ TAH        Social History:      Social History   Tobacco Use  . Smoking status: Former Games developer  . Smokeless tobacco: Never Used  Substance Use Topics  . Alcohol use: No       Family History :     Family History  Problem Relation Age of Onset  . High blood pressure Mother   . Cancer Father       Home Medications:   Prior to Admission medications   Medication Sig Start Date End Date Taking? Authorizing Provider  ADVAIR DISKUS 250-50 MCG/DOSE AEPB  03/21/13   [provider]  cephALEXin (  KEFLEX) 500 MG capsule Take 1 capsule (500 mg total) by mouth 2 (two) times daily. Patient not taking: Reported on 09/19/2019 08/24/19   Harvest Dark, MD  D 1000 1000 UNITS CHEW  02/01/13   [provider]  Fexofenadine HCl (ALLEGRA PO) Take by mouth daily.    [provider]  fluticasone Asencion Islam) 50 MCG/ACT nasal spray  01/29/13   [provider]  montelukast (SINGULAIR) 10 MG tablet Take 10 mg by mouth at bedtime.    [provider]  moxifloxacin (AVELOX) 400 MG tablet  02/11/13   [provider]  nystatin (MYCOSTATIN) 100000 UNIT/ML suspension  03/17/13   [provider]  omeprazole (PRILOSEC) 40 MG capsule  03/17/13   [provider]  oxybutynin (DITROPAN-XL) 10 MG 24 hr tablet  02/09/13   [provider]  Doug Sou 180 MCG/ACT inhaler  03/14/13   [provider]  SPIRIVA HANDIHALER 18 MCG inhalation capsule  03/18/13   [provider]  Penne Lash [facility] 45 MCG/ACT inhaler  03/19/13   [provider]     Allergies:     Allergies  Allergen  Reactions  . Mirtazapine     Hallucinations  . Penicillins Rash     Physical Exam:   Vitals  Blood pressure (!) 158/74, pulse 82, temperature 97.6 F (36.4 C), temperature source Oral, resp. rate 20, height 5\' 1"  (1.549 m), weight 48 kg, SpO2 94 %.  1.  General: Laying supine in bed, in no acute distress  2. Psychiatric: Agitated, uncooperative with exam  3. Neurologic: No focal deficits in strength and sensation Patient does not participate in cerebellar testing  4. HEENMT:  Head is atraumatic, normocephalic, external ears are normal, allergic conjunctivitis - mild, trachea midline, neck cachectic  5. Respiratory : LTABL  6. Cardiovascular : HRRR  7. Gastrointestinal:  Soft, non distended, nontender to palpation  8. Skin:  Ulcer on left lower leg with surrounding erythema  9.Musculoskeletal:  Bruising, no peripheral edema    Data Review:    CBC Recent Labs  Lab 09/19/19 1446  WBC 12.8*  12.7*  HGB 10.0*  10.0*  HCT 33.7*  33.5*  PLT 225  220  MCV 88.0  87.7  MCH 26.1  26.2  MCHC 29.7*  29.9*  RDW 17.1*  17.1*  LYMPHSABS 0.7  MONOABS 0.3  EOSABS 0.0  BASOSABS 0.0   ------------------------------------------------------------------------------------------------------------------  Results for orders placed or performed during the hospital encounter of 09/19/19 (from the past 48 hour(s))  Comprehensive metabolic panel     Status: Abnormal   Collection Time: 09/19/19  2:46 PM  Result Value Ref Range   Sodium 140 135 - 145 mmol/L   Potassium 4.1 3.5 - 5.1 mmol/L   Chloride 103 98 - 111 mmol/L   CO2 29 22 - 32 mmol/L   Glucose, Bld 125 (H) 70 - 99 mg/dL    Comment: Glucose reference range applies only to samples taken after fasting for at least 8 hours.   BUN 33 (H) 8 - 23 mg/dL   Creatinine, Ser 1.09 (H) 0.44 - 1.00 mg/dL   Calcium 8.8 (L) 8.9 - 10.3 mg/dL   Total Protein 5.9 (L) 6.5 - 8.1 g/dL   Albumin 3.7 3.5 - 5.0 g/dL   AST 14 (L)  15 - 41 U/L   ALT 13 0 - 44 U/L   Alkaline Phosphatase 53 38 - 126 U/L   Total Bilirubin 0.7 0.3 - 1.2 mg/dL   GFR calc  non Af Amer 42 (L) >60 mL/min   GFR calc Af Amer 49 (L) >60 mL/min   Anion gap 8 5 - 15    Comment: Performed at [facility], [address] [place], [place] [address]  CBC     Status: Abnormal   Collection Time: 09/19/19  2:46 PM  Result Value Ref Range   WBC 12.7 (H) 4.0 - 10.5 K/uL   RBC 3.82 (L) 3.87 - 5.11 MIL/uL   Hemoglobin 10.0 (L) 12.0 - 15.0 g/dL   HCT 60.433.5 (L) 54.036.0 - 98.146.0 %   MCV 87.7 80.0 - 100.0 fL   MCH 26.2 26.0 - 34.0 pg   MCHC 29.9 (L) 30.0 - 36.0 g/dL   RDW 19.117.1 (H) 47.811.5 - 29.515.5 %   Platelets 220 150 - 400 K/uL   nRBC 0.0 0.0 - 0.2 %    Comment: Performed at [facility], [address] [place], [place] [address]  Protime-INR - (order if patient is taking Coumadin / Warfarin)     Status: None   Collection Time: 09/19/19  2:46 PM  Result Value Ref Range   Prothrombin Time 12.3 11.4 - 15.2 seconds   INR 1.0 0.8 - 1.2    Comment: (NOTE) INR goal varies based on device and disease states. Performed at [facility], [address] [place], [place] [address]   Troponin I (High Sensitivity)     Status: None   Collection Time: 09/19/19  2:46 PM  Result Value Ref Range   Troponin I (High Sensitivity) 9 <18 ng/L    Comment: (NOTE) Elevated high sensitivity troponin I (hsTnI) values and significant  changes across serial measurements may suggest ACS but many other  chronic and acute conditions are known to elevate hsTnI results.  Refer to the "Links" section for chest pain algorithms and additional  guidance. Performed at [facility], [address] [place], [place] [address]   CBC with Differential/Platelet     Status: Abnormal   Collection Time: 09/19/19  2:46 PM  Result Value Ref Range   WBC 12.8 (H) 4.0 - 10.5 K/uL   RBC 3.83 (L) 3.87 - 5.11 MIL/uL   Hemoglobin 10.0 (L) 12.0 - 15.0 g/dL   HCT  95.233.7 (L) 84.136.0 - 46.0 %   MCV 88.0 80.0 - 100.0 fL   MCH 26.1 26.0 - 34.0 pg   MCHC 29.7 (L) 30.0 - 36.0 g/dL   RDW 32.417.1 (H) 40.111.5 - 02.715.5 %   Platelets 225 150 - 400 K/uL   nRBC 0.0 0.0 - 0.2 %   Neutrophils Relative % 92 %   Neutro Abs 11.7 (H) 1.7 - 7.7 K/uL   Lymphocytes Relative 5 %   Lymphs Abs 0.7 0.7 - 4.0 K/uL   Monocytes Relative 2 %   Monocytes Absolute 0.3 0.1 - 1.0 K/uL   Eosinophils Relative 0 %   Eosinophils Absolute 0.0 0.0 - 0.5 K/uL   Basophils Relative 0 %   Basophils Absolute 0.0 0.0 - 0.1 K/uL   Immature Granulocytes 1 %   Abs Immature Granulocytes 0.11 (H) 0.00 - 0.07 K/uL    Comment: Performed at [facility], [address] [place], [place] [address]  Ethanol     Status: None   Collection Time: 09/19/19  5:20 PM  Result Value Ref Range   Alcohol, Ethyl (B) <10 <10 mg/dL    Comment: (NOTE) Lowest detectable limit for serum alcohol is 10 mg/dL. For medical purposes only.  Performed at [facility], [address] [place], [place] [address]   Lactic acid, plasma     Status: None   Collection Time: 09/19/19  5:20 PM  Result Value Ref Range   Lactic Acid, Venous 1.0 0.5 - 1.9 mmol/L    Comment: Performed at [facility], [address] [place], [place] [address]  Troponin I (High Sensitivity)     Status: None   Collection Time: 09/19/19  5:20 PM  Result Value Ref Range   Troponin I (High Sensitivity) 11 <18 ng/L    Comment: (NOTE) Elevated high sensitivity troponin I (hsTnI) values and significant  changes across serial measurements may suggest ACS but many other  chronic and acute conditions are known to elevate hsTnI results.  Refer to the "Links" section for chest pain algorithms and additional  guidance. Performed at [facility], [address] [place], [place] [address]     Chemistries  Recent Labs  Lab 09/19/19 1446  NA 140  K 4.1  CL 103  CO2 29  GLUCOSE 125*  BUN 33*  CREATININE 1.09*   CALCIUM 8.8*  AST 14*  ALT 13  ALKPHOS 53  BILITOT 0.7   ------------------------------------------------------------------------------------------------------------------  ------------------------------------------------------------------------------------------------------------------ GFR: Estimated Creatinine Clearance: 21.7 mL/min (A) (by C-G formula based on SCr of 1.09 mg/dL (H)). Liver Function Tests: Recent Labs  Lab 09/19/19 1446  AST 14*  ALT 13  ALKPHOS 53  BILITOT 0.7  PROT 5.9*  ALBUMIN 3.7   No results for input(s): LIPASE, AMYLASE in the last 168 hours. No results for input(s): AMMONIA in the last 168 hours. Coagulation Profile: Recent Labs  Lab 09/19/19 1446  INR 1.0   Cardiac Enzymes: No results for input(s): CKTOTAL, CKMB, CKMBINDEX, TROPONINI in the last 168 hours. BNP (last 3 results) No results for input(s): PROBNP in the last 8760 hours. HbA1C: No results for input(s): HGBA1C in the last 72 hours. CBG: No results for input(s): GLUCAP in the last 168 hours. Lipid Profile: No results for input(s): CHOL, HDL, LDLCALC, TRIG, CHOLHDL, LDLDIRECT in the last 72 hours. Thyroid Function Tests: No results for input(s): TSH, T4TOTAL, FREET4, T3FREE, THYROIDAB in the last 72 hours. Anemia Panel: No results for input(s): VITAMINB12, FOLATE, FERRITIN, TIBC, IRON, RETICCTPCT in the last 72 hours.  --------------------------------------------------------------------------------------------------------------- Urine analysis:    Component Value Date/Time   COLORURINE YELLOW (A) 08/24/2019 2012   APPEARANCEUR HAZY (A) 08/24/2019 2012   LABSPEC 1.021 08/24/2019 2012   PHURINE 7.0 08/24/2019 2012   GLUCOSEU NEGATIVE 08/24/2019 2012   HGBUR NEGATIVE 08/24/2019 2012   BILIRUBINUR NEGATIVE 08/24/2019 2012   KETONESUR NEGATIVE 08/24/2019 2012   PROTEINUR NEGATIVE 08/24/2019 2012   NITRITE POSITIVE (A) 08/24/2019 2012   LEUKOCYTESUR TRACE (A) 08/24/2019 2012       Imaging Results:    CT Head Wo Contrast  Result Date: 09/19/2019 CLINICAL DATA:  Head trauma, found down post fall, frontal bruise, confusion EXAM: CT HEAD WITHOUT CONTRAST TECHNIQUE: Contiguous axial images were obtained from the base of the skull through the vertex without intravenous contrast. Sagittal and coronal MPR images reconstructed from axial data set. COMPARISON:  08/24/2019 FINDINGS: Brain: Generalized atrophy with mild ex vacuo dilatation of the ventricular system. No midline shift or mass effect. Small vessel chronic ischemic changes of deep cerebral white matter. No intracranial hemorrhage, mass lesion or evidence of acute infarction. No extra-axial fluid collections. Vascular: Atherosclerotic calcification of internal carotid arteries at skull base Skull: Demineralized but intact Sinuses/Orbits: Partial opacification  of ethmoid air cells bilaterally as well as inferior RIGHT mastoid air cells, unchanged. Nasal septal deviation to the LEFT. Other: N/A IMPRESSION: Atrophy with small vessel chronic ischemic changes of deep cerebral white matter. No acute intracranial abnormalities. Mild chronic sinus changes as above. Electronically Signed   By: Ulyses Southward M.D.   On: 09/19/2019 17:06   DG Chest Portable 1 View  Result Date: 09/19/2019 CLINICAL DATA:  Altered mental status. Fall. History of asthma and smoking. EXAM: PORTABLE CHEST 1 VIEW COMPARISON:  None. FINDINGS: Heart size is normal. Lungs are free of focal consolidations. There is a small LEFT pleural effusion. Opacity at the LATERAL LEFT lung base may be related to atelectasis or scarring. There is deformity of LATERAL LEFT ribs, including the LEFT 6, 7, 8 ribs, adjacent to the region of the LEFT pleural and parenchymal changes. Findings are favored to represent acute fractures but could be chronic. No pneumothorax. IMPRESSION: 1. LEFT LOWER lobe atelectasis, contusion, or scarring and small LEFT effusion. 2. Suspect acute fractures  of the LEFT LATERAL ribs 6, 7, and 8. Electronically Signed   By: Norva Pavlov M.D.   On: 09/19/2019 15:53       Assessment & Plan:    Active Problems:   Altered mental status   1. Cellulitis 1. Left leg cellulitis 2. Start vanc 3. Likely to be able to discharge tomorrow with PO antibiotics 2. Altered Mental status 1. Most likely 2/2 to vascular dementia and steady decline 2. Infection could be contributing, treat as above 3. TSH, B12, folate pending 4. Continue to monitor with tele sitter 3. Recurrent Falls 1. Consult PT  2. When patient is agreeable - she will likely need placement in SNF. PCP has approached this topic with her, but she is resistant. 3. Encourage use of walker 4. Rib fractures 1. -Likely from fall 2 weeks ago. 2. Mild tenderness 3. Tylenol for pain 5. CKD IIIb 1. Slight dip in GFR 2. Treat with IV fluids 3. Continue to monitr   DVT Prophylaxis-   Heparin, SCDs  AM Labs Ordered, also please review Full Orders  Family Communication: Admission, patients condition and plan of care including tests being ordered have been discussed with the patient and daughter who indicate understanding and agree with the plan and Code Status.  Code Status:  DNR  Admission status: Observation    The patient's presenting symptoms include altered mental status, fall The worrisome physical exam findings include Cellulitis The chronic co-morbidities include Depression, asthma, COPD,        Time spent in minutes : 62   Anil Havard B Zierle-Ghosh M.D

## 2019-09-20 NOTE — Progress Notes (Signed)
PROGRESS NOTE    Theresa Luna  XAJ:[id number] DOB: 14-Sep-1918 DOA: 09/19/2019 PCP: Joseph Berkshire, MD   Chief complaint.  Confusion.  Brief Narrative: (Start on day 1 of progress note - keep it brief and live) Patient is a 86 year old female with history of asthma, vascular dementia who currently lives in  assisted living facility who present to the hospital with frequent falls.  She also had altered mental status for the last 3 to 4 weeks, she had increased confusion, irritation and agitation.  Patient also has poor appetite. Upon arrival in the emergency room, she was found to have left lower extremity cellulitis.   Assessment & Plan:   Active Problems:   Altered mental status   Pressure injury of skin  #1.  Left leg cellulitis. Her leg looks red, but I am not sure this is the source of altered mental status.  The leg is nontender today, only mild swelling.  I will continue vancomycin started yesterday.  2.  Altered mental status. Most likely secondary to vascular dementia.  Also has B12 deficiency.  We will give injection of B12 today.  Continue to treat cellulitis. Patient was also recently given benzodiazepine at home, which may make confusion worse.  Would not restart it. I discussed with daughter, patient probably should not return to her independent living.  Social worker is consulted.  3.  Vitamin B12 deficient anemia. We will give injection of B12 today and tomorrow.  Then supplement orally.  4.  Frequent falls. PT consulted.  5.  Rib fractures.  From the falls. Currently patient is not complaining of pain.  Will follow.  6.  CKD stage III. Follow.  7.  Vascular dementia. Follow.  8.  Essential hypertension. We will monitor for now.   DVT prophylaxis: Hepatin SQ Code Status: DNR Family Communication: Discussed the patient condition with daughter on the phone, all questions answered.  Disposition Plan:  . Patient came from: Assisted living                          . Anticipated d/c place: SNF . Barriers to d/c OR conditions which need to be met to effect a safe d/c:   Consultants:   None  Procedures: None Antimicrobials: Vancomycin  Subjective: Patient still very confused this morning.  She does not know she is in the hospital. No shortness of breath or cough. No diarrhea or abdominal pain.  No nausea vomiting. No fever or chills.  Objective: Vitals:   09/19/19 2100 09/20/19 0148 09/20/19 0246 09/20/19 0311  BP: (!) 158/74 (!) 187/76 (!) 170/68   Pulse: 82 71 78   Resp:  20    Temp:   97.6 F (36.4 C)   TempSrc:   Oral   SpO2: 94% 96% 97%   Weight:    48 kg  Height:    5' 1" (1.549 m)    Intake/Output Summary (Last 24 hours) at 09/20/2019 0921 Last data filed at 09/20/2019 0906 Gross per 24 hour  Intake 717.45 ml  Output --  Net 717.45 ml   Filed Weights   09/19/19 1433 09/20/19 0311  Weight: 48 kg 48 kg    Examination:  General exam: sleepy and chronically ill. Respiratory system: Clear to auscultation. Respiratory effort normal. Cardiovascular system: S1 & S2 heard, RRR. No JVD, murmurs, rubs, gallops or clicks. No pedal edema. Gastrointestinal system: Abdomen is nondistended, soft and nontender. No organomegaly or masses felt. Normal bowel  sounds heard. Central nervous system: Drowsy and confused.  No focal neurological deficits. Extremities: Left leg skin looks red, no significant swelling, not much tenderness today. Skin: No rashes, lesions or ulcers Psychiatry: Confused    Data Reviewed: I have personally reviewed following labs and imaging studies  CBC: Recent Labs  Lab 09/19/19 1446 09/20/19 0440  WBC 12.8*  12.7* 12.1*  NEUTROABS 11.7* 8.3*  HGB 10.0*  10.0* 10.8*  HCT 33.7*  33.5* 36.1  MCV 88.0  87.7 87.8  PLT 225  220 573   Basic Metabolic Panel: Recent Labs  Lab 09/19/19 1446 09/20/19 0440  NA 140 142  K 4.1 4.0  CL 103 103  CO2 29 29  GLUCOSE 125* 86  BUN 33* 25*   CREATININE 1.09* 0.90  CALCIUM 8.8* 9.1  MG  --  2.0   GFR: Estimated Creatinine Clearance: 26.3 mL/min (by C-G formula based on SCr of 0.9 mg/dL). Liver Function Tests: Recent Labs  Lab 09/19/19 1446 09/20/19 0440  AST 14* 17  ALT 13 11  ALKPHOS 53 52  BILITOT 0.7 0.7  PROT 5.9* 5.9*  ALBUMIN 3.7 3.5   No results for input(s): LIPASE, AMYLASE in the last 168 hours. No results for input(s): AMMONIA in the last 168 hours. Coagulation Profile: Recent Labs  Lab 09/19/19 1446  INR 1.0   Cardiac Enzymes: No results for input(s): CKTOTAL, CKMB, CKMBINDEX, TROPONINI in the last 168 hours. BNP (last 3 results) No results for input(s): PROBNP in the last 8760 hours. HbA1C: No results for input(s): HGBA1C in the last 72 hours. CBG: No results for input(s): GLUCAP in the last 168 hours. Lipid Profile: No results for input(s): CHOL, HDL, LDLCALC, TRIG, CHOLHDL, LDLDIRECT in the last 72 hours. Thyroid Function Tests: Recent Labs    09/19/19 2235  TSH 3.158   Anemia Panel: Recent Labs    09/19/19 2235  VITAMINB12 166*   Sepsis Labs: Recent Labs  Lab 09/19/19 1720  LATICACIDVEN 1.0    Recent Results (from the past 240 hour(s))  SARS Coronavirus 2 by RT PCR (hospital order, performed in [facility] hospital lab) Nasopharyngeal Nasopharyngeal Swab     Status: None   Collection Time: 09/20/19 12:54 AM   Specimen: Nasopharyngeal Swab  Result Value Ref Range Status   SARS Coronavirus 2 NEGATIVE NEGATIVE Final    Comment: (NOTE) SARS-CoV-2 target nucleic acids are NOT DETECTED. The SARS-CoV-2 RNA is generally detectable in upper and lower respiratory specimens during the acute phase of infection. The lowest concentration of SARS-CoV-2 viral copies this assay can detect is 250 copies / mL. A negative result does not preclude SARS-CoV-2 infection and should not be used as the sole basis for treatment or other patient management decisions.  A negative result may occur  with improper specimen collection / handling, submission of specimen other than nasopharyngeal swab, presence of viral mutation(s) within the areas targeted by this assay, and inadequate number of viral copies (<250 copies / mL). A negative result must be combined with clinical observations, patient history, and epidemiological information. Fact Sheet for Patients:   StrictlyIdeas.no Fact Sheet for Healthcare Providers: BankingDealers.co.za This test is not yet approved or cleared  by the [place] FDA and has been authorized for detection and/or diagnosis of SARS-CoV-2 by FDA under an Emergency Use Authorization (EUA).  This EUA will remain in effect (meaning this test can be used) for the duration of the COVID-19 declaration under Section 564(b)(1) of the Act, 21 U.S.C. section 360bbb-3(b)(1),  unless the authorization is terminated or revoked sooner. Performed at [facility], [address], [place], [place] [address]          Radiology Studies: CT Head Wo Contrast  Result Date: 09/19/2019 CLINICAL DATA:  Head trauma, found down post fall, frontal bruise, confusion EXAM: CT HEAD WITHOUT CONTRAST TECHNIQUE: Contiguous axial images were obtained from the base of the skull through the vertex without intravenous contrast. Sagittal and coronal MPR images reconstructed from axial data set. COMPARISON:  08/24/2019 FINDINGS: Brain: Generalized atrophy with mild ex vacuo dilatation of the ventricular system. No midline shift or mass effect. Small vessel chronic ischemic changes of deep cerebral white matter. No intracranial hemorrhage, mass lesion or evidence of acute infarction. No extra-axial fluid collections. Vascular: Atherosclerotic calcification of internal carotid arteries at skull base Skull: Demineralized but intact Sinuses/Orbits: Partial opacification of ethmoid air cells bilaterally as well as inferior RIGHT mastoid air  cells, unchanged. Nasal septal deviation to the LEFT. Other: N/A IMPRESSION: Atrophy with small vessel chronic ischemic changes of deep cerebral white matter. No acute intracranial abnormalities. Mild chronic sinus changes as above. Electronically Signed   By: Lavonia Dana M.D.   On: 09/19/2019 17:06   DG Chest Portable 1 View  Result Date: 09/19/2019 CLINICAL DATA:  Altered mental status. Fall. History of asthma and smoking. EXAM: PORTABLE CHEST 1 VIEW COMPARISON:  None. FINDINGS: Heart size is normal. Lungs are free of focal consolidations. There is a small LEFT pleural effusion. Opacity at the LATERAL LEFT lung base may be related to atelectasis or scarring. There is deformity of LATERAL LEFT ribs, including the LEFT 6, 7, 8 ribs, adjacent to the region of the LEFT pleural and parenchymal changes. Findings are favored to represent acute fractures but could be chronic. No pneumothorax. IMPRESSION: 1. LEFT LOWER lobe atelectasis, contusion, or scarring and small LEFT effusion. 2. Suspect acute fractures of the LEFT LATERAL ribs 6, 7, and 8. Electronically Signed   By: Nolon Nations M.D.   On: 09/19/2019 15:53        Scheduled Meds: . acetaminophen  650 mg Oral BID  . heparin  5,000 Units Subcutaneous Q8H  . montelukast  10 mg Oral QHS  . pantoprazole  40 mg Oral Daily  . predniSONE  10 mg Oral q morning - 10a  . psyllium  1 packet Oral Daily   Continuous Infusions: . sodium chloride 75 mL/hr at 09/20/19 0124  . [START ON 09/21/2019] vancomycin       LOS: 0 days    Time spent: 35 minutes    Sharen Hones, MD [facility]   To contact the attending provider between 7A-7P or the covering provider during after hours 7P-7A, please log into the web site [email or web address] and access using universal [place] password for that web site. If you do not have the password, please call the hospital operator.  09/20/2019, 9:21 AM

## 2019-09-20 NOTE — ED Notes (Signed)
Assigned bed @ 0100, spoke with RN Nolon Rod

## 2019-09-20 NOTE — Evaluation (Signed)
Physical Therapy Evaluation Patient Details Name: Theresa Luna MRN: [id number] DOB: Apr 30, 1918 Today's Date: 09/20/2019   History of Present Illness  Pt admitted for AMS s/p fall and confusion. History includes dementia and asthma. According to chart, pt has been declining past 3-4 weeks. Attempted to call daughter for more accurate history, however no answer at this time. Of note, possible acute vs chronic rib fx on 6,7,8 and L leg cellulitis. Pt declines pain in either area at this time.  Clinical Impression  Pt is a pleasant 86 year old female who was admitted for AMS and fall. Pt performs bed mobility with min assist, transfers with mod assist, and ambulation with min assist and RW. Pt is poor historian and becomes agitated easily. Demanding this Probation officer to call her daughter because she wants her glasses and purse. Called number listed in chart, however no answer. This made pt more agitated. Concerns relayed to charge nurse as primary RN not available. Pt is high falls risk, chair alarm turned on in chair. Pt demonstrates deficits with strength/balance/mobility. Would benefit from skilled PT to address above deficits and promote optimal return to PLOF; recommend transition to STR upon discharge from acute hospitalization.     Follow Up Recommendations SNF    Equipment Recommendations  (TBD)    Recommendations for Other Services       Precautions / Restrictions Precautions Precautions: Fall Restrictions Weight Bearing Restrictions: No      Mobility  Bed Mobility Overal bed mobility: Needs Assistance Bed Mobility: Supine to Sit     Supine to sit: Min assist     General bed mobility comments: needs assist for sequencing and physical assist with trunk. Once seated at EOB, able to sit with upright posture  Transfers Overall transfer level: Needs assistance Equipment used: Rolling walker (2 wheeled) Transfers: Sit to/from Stand Sit to Stand: Mod assist         General  transfer comment: Pt becomes agitated with physical stating "stop trying to help me". Pt unable to stand independently despite attempts. Needed mod assist to transfer using RW.  Ambulation/Gait Ambulation/Gait assistance: Min assist Gait Distance (Feet): 3 Feet Assistive device: Rolling walker (2 wheeled) Gait Pattern/deviations: Step-to pattern     General Gait Details: fatigues rapidly and becomes SOB with exertion. O2 sats at 96% on RA HR at 119bpm. Pt encouraged for deep breathing. Slow and unsteady steps towards recliner  Stairs            Wheelchair Mobility    Modified Rankin (Stroke Patients Only)       Balance Overall balance assessment: History of Falls;Needs assistance Sitting-balance support: Feet supported;Bilateral upper extremity supported Sitting balance-Leahy Scale: Fair     Standing balance support: Bilateral upper extremity supported Standing balance-Leahy Scale: Fair                               Pertinent Vitals/Pain Pain Assessment: No/denies pain    Home Living Family/patient expects to be discharged to:: Assisted living Living Arrangements: (lives at assisted living)             Home Equipment: Kasandra Knudsen - single point;Wheelchair - manual Additional Comments: pt is poor historian. inconsistent with history.      Prior Function Level of Independence: Independent with assistive device(s)         Comments: poor historian. Per patient, she uses SPC and WC for longer distances     Hand Dominance  Extremity/Trunk Assessment   Upper Extremity Assessment Upper Extremity Assessment: Generalized weakness;Difficult to assess due to impaired cognition(appears grossly 3+/5)    Lower Extremity Assessment Lower Extremity Assessment: Generalized weakness;Difficult to assess due to impaired cognition(B LE grossly 3+/5)       Communication   Communication: No difficulties  Cognition Arousal/Alertness: (sleepy upon  arrival, however awakens easily) Behavior During Therapy: Agitated;Restless Overall Cognitive Status: No family/caregiver present to determine baseline cognitive functioning                                 General Comments: seems alert to place, however inconsistent with responses to date/situation/time. Becomes easily agitated      General Comments General comments (skin integrity, edema, etc.): pt becomes very anxious regarding SOB symptoms. Pt reports she wears O2 chronically, although sats are WNL with exertion and no O2 in room to apply. Attempted to call RN, however off unit. Relayed concern to charge RN    Exercises Other Exercises Other Exercises: Seated ther-ex performed on B LE including LAQ, heel slides, and hip abd/add. All ther-ex performed x 10 reps with min assist. Heavy cues for sequencing   Assessment/Plan    PT Assessment Patient needs continued PT services  PT Problem List Decreased strength;Decreased activity tolerance;Decreased balance;Decreased mobility;Decreased cognition;Decreased safety awareness       PT Treatment Interventions Gait training;Therapeutic activities;Therapeutic exercise;Balance training    PT Goals (Current goals can be found in the Care Plan section)  Acute Rehab PT Goals Patient Stated Goal: to get [place] here PT Goal Formulation: With patient Time For Goal Achievement: 10/04/19 Potential to Achieve Goals: Good    Frequency Min 2X/week   Barriers to discharge        Co-evaluation               AM-PAC PT "6 Clicks" Mobility  Outcome Measure Help needed turning from your back to your side while in a flat bed without using bedrails?: A Little Help needed moving from lying on your back to sitting on the side of a flat bed without using bedrails?: A Little Help needed moving to and from a bed to a chair (including a wheelchair)?: A Lot Help needed standing up from a chair using your arms (e.g., wheelchair or bedside  chair)?: A Lot Help needed to walk in hospital room?: A Lot Help needed climbing 3-5 steps with a railing? : Total 6 Click Score: 13    End of Session Equipment Utilized During Treatment: Gait belt Activity Tolerance: Patient limited by fatigue;Treatment limited secondary to agitation Patient left: in chair;with chair alarm set Nurse Communication: Mobility status PT Visit Diagnosis: Unsteadiness on feet (R26.81);Muscle weakness (generalized) (M62.81);History of falling (Z91.81);Repeated falls (R29.6);Difficulty in walking, not elsewhere classified (R26.2)    Time: 2683-4196 PT Time Calculation (min) (ACUTE ONLY): 25 min   Charges:   PT Evaluation $PT Eval Moderate Complexity: 1 Mod PT Treatments $Therapeutic Exercise: 8-22 mins        Elizabeth Palau, PT, DPT [phone]   Dariusz Brase 09/20/2019, 12:07 PM
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
## 2019-09-20 NOTE — Plan of Care (Signed)
  Problem: Health Behavior/Discharge Planning: Goal: Ability to manage health-related needs will improve Outcome: Progressing   Problem: Clinical Measurements: Goal: Ability to maintain clinical measurements within normal limits will improve Outcome: Progressing   

## 2019-09-21 DIAGNOSIS — L03116 Cellulitis of left lower limb: Principal | ICD-10-CM

## 2019-09-21 DIAGNOSIS — F0151 Vascular dementia with behavioral disturbance: Secondary | ICD-10-CM

## 2019-09-21 DIAGNOSIS — G9341 Metabolic encephalopathy: Secondary | ICD-10-CM

## 2019-09-21 LAB — FOLATE RBC
Folate, Hemolysate: 336 ng/mL
Folate, RBC: 939 ng/mL (ref >498)
Hematocrit: 35.8 % (ref 34.0–46.6)

## 2019-09-21 MED ORDER — TIOTROPIUM BROMIDE MONOHYDRATE 18 MCG IN CAPS
18.0000 ug | ORAL_CAPSULE | Freq: Every day | RESPIRATORY_TRACT | Status: DC
  Filled 2019-09-21: qty 5

## 2019-09-21 MED ORDER — ZOLPIDEM TARTRATE 5 MG PO TABS: 5.0000 mg | ORAL_TABLET | Freq: Every evening | ORAL | Status: DC | PRN

## 2019-09-21 MED ORDER — CYANOCOBALAMIN 1000 MCG/ML IJ SOLN
1000.0000 ug | Freq: Once | INTRAMUSCULAR | Status: AC
  Administered 2019-09-21: 1000 ug via INTRAMUSCULAR
  Filled 2019-09-21: qty 1

## 2019-09-21 MED ORDER — QUETIAPINE FUMARATE 25 MG PO TABS
25.0000 mg | ORAL_TABLET | Freq: Two times a day (BID) | ORAL | Status: DC
  Administered 2019-09-21 – 2019-09-23 (×5): 25 mg via ORAL
  Filled 2019-09-21 (×5): qty 1

## 2019-09-21 MED ORDER — VITAMIN B-12 1000 MCG PO TABS
1000.0000 ug | ORAL_TABLET | Freq: Every day | ORAL | Status: DC
  Administered 2019-09-22 – 2019-09-23 (×2): 1000 ug via ORAL
  Filled 2019-09-21 (×2): qty 1

## 2019-09-21 NOTE — NC FL2 (Signed)
[place] LEVEL OF CARE SCREENING TOOL     IDENTIFICATION  Patient Name: Theresa Luna Birthdate: 1918-05-08 Sex: female Admission Date (Current Location): 09/19/2019  [place] and [place] Number:  Engineering geologist and Address:  [facility], [address], [place], [place] [address]      Provider Number: [id number]  Attending Physician Name and Address:  Sharen Hones, MD  Relative Name and Phone Number:  McPherson,Melissa (Daughter) [phone]    Current Level of Care: Hospital Recommended Level of Care: [place] Prior Approval Number:    Date Approved/Denied:   PASRR Number: [id number]  Discharge Plan: SNF    Current Diagnoses: Patient Active Problem List   Diagnosis Date Noted  . Altered mental status 09/20/2019  . Pressure injury of skin 09/20/2019  . Left leg cellulitis 09/20/2019  . Vascular dementia ([place]) 09/20/2019  . Vitamin B12 deficiency anemia 09/20/2019  . Acute metabolic encephalopathy 47/12/6281    Orientation RESPIRATION BLADDER Height & Weight     Self, Place  Normal Continent Weight: 105 lb 13.1 oz (48 kg) Height:  5\' 1"  (154.9 cm)  BEHAVIORAL SYMPTOMS/MOOD NEUROLOGICAL BOWEL NUTRITION STATUS      Continent Diet  AMBULATORY STATUS COMMUNICATION OF NEEDS Skin   Extensive Assist Verbally Normal                       Personal Care Assistance Level of Assistance  Bathing, Feeding, Dressing, Total care Bathing Assistance: Maximum assistance Feeding assistance: Limited assistance Dressing Assistance: Maximum assistance Total Care Assistance: Maximum assistance   Functional Limitations Info             SPECIAL CARE FACTORS FREQUENCY                       Contractures Contractures Info: Not present    Additional Factors Info  Allergies Code Status Info: DNR Allergies Info: Pencillins and Mirtazapin           Current Medications (09/21/2019):  This is the current  hospital active medication list Current Facility-Administered Medications  Medication Dose Route Frequency Provider Last Rate Last Admin  . acetaminophen (TYLENOL) tablet 650 mg  650 mg Oral Q6H PRN Zierle-Ghosh, Asia B, DO       Or  . acetaminophen (TYLENOL) suppository 650 mg  650 mg Rectal Q6H PRN Zierle-Ghosh, Asia B, DO      . acetaminophen (TYLENOL) tablet 650 mg  650 mg Oral BID Zierle-Ghosh, Asia B, DO   650 mg at 09/20/19 2003  . albuterol (PROVENTIL) (2.5 MG/3ML) 0.083% nebulizer solution 2.5 mg  2.5 mg Nebulization Q4H PRN Zierle-Ghosh, Asia B, DO   2.5 mg at 09/21/19 0447  . cyanocobalamin ((VITAMIN B-12)) injection 1,000 mcg  1,000 mcg Intramuscular Once Sharen Hones, MD      . cyanocobalamin ((VITAMIN B-12)) injection 1,000 mcg  1,000 mcg Intramuscular Once Sharen Hones, MD      . heparin injection 5,000 Units  5,000 Units Subcutaneous Q8H Zierle-Ghosh, Asia B, DO   5,000 Units at 09/21/19 0447  . HYDROcodone-acetaminophen (NORCO/VICODIN) 5-325 MG per tablet 1-2 tablet  1-2 tablet Oral Q4H PRN Zierle-Ghosh, Asia B, DO   1 tablet at 09/20/19 2151  . montelukast (SINGULAIR) tablet 10 mg  10 mg Oral QHS Zierle-Ghosh, Asia B, DO   10 mg at 09/20/19 2003  . ondansetron (ZOFRAN) tablet 4 mg  4 mg Oral Q6H PRN Zierle-Ghosh, Asia B, DO  Or  . ondansetron (ZOFRAN) injection 4 mg  4 mg Intravenous Q6H PRN Zierle-Ghosh, Asia B, DO      . pantoprazole (PROTONIX) EC tablet 40 mg  40 mg Oral Daily Zierle-Ghosh, Asia B, DO   40 mg at 09/20/19 0939  . polyethylene glycol (MIRALAX / GLYCOLAX) packet 17 g  17 g Oral Daily PRN Zierle-Ghosh, Asia B, DO      . predniSONE (DELTASONE) tablet 10 mg  10 mg Oral q morning - 10a Zierle-Ghosh, Asia B, DO   10 mg at 09/20/19 0939  . psyllium (HYDROCIL/METAMUCIL) packet 1 packet  1 packet Oral Daily Zierle-Ghosh, Asia B, DO   1 packet at 09/20/19 0940  . QUEtiapine (SEROQUEL) tablet 25 mg  25 mg Oral BID Marrion Coy, MD   25 mg at 09/21/19 1321  .  tiotropium (SPIRIVA) inhalation capsule ([facility] use ONLY) 18 mcg  18 mcg Inhalation Daily Marrion Coy, MD      . vancomycin (VANCOREADY) IVPB 750 mg/150 mL  750 mg Intravenous Q36H Zierle-Ghosh, Asia B, DO      . [START ON 09/22/2019] vitamin B-12 (CYANOCOBALAMIN) tablet 1,000 mcg  1,000 mcg Oral Daily Marrion Coy, MD         Discharge Medications: Please see discharge summary for a list of discharge medications.  Relevant Imaging Results:  Relevant Lab Results:   Additional Information SS# [id number]  Jevon Shells, Lemar Livings, LCSW

## 2019-09-21 NOTE — Progress Notes (Addendum)
PROGRESS NOTE    Theresa Luna  WEX:[id number] DOB: [birth year] DOA: 09/19/2019 PCP: Joseph Berkshire, MD   Chief complaint.  Altered mental status. Brief Narrative:   Patient is a 86 year old female with history of asthma, vascular dementia who currently lives in  assisted living facility who present to the hospital with frequent falls.  She also had altered mental status for the last 3 to 4 weeks, she had increased confusion, irritation and agitation.  Patient also has poor appetite. Upon arrival in the emergency room, she was found to have left lower extremity cellulitis.  6/7.  Patient was started on Seroquel 25 mg every evening last night.  She was very agitated early last night, but was able to sleep half the night later.  Continued Seroquel.  Patient was also taking Ambien long-term, will start lower dose again.    Assessment & Plan:   Active Problems:   Altered mental status   Pressure injury of skin   Left leg cellulitis   Vascular dementia ([place])   Vitamin B12 deficiency anemia   Acute metabolic encephalopathy  #1.  Left leg cellulitis. Appear to be improving.  I will continue vancomycin for 1 more day.  Plan to change to  doxycycline tomorrow.  #2.  Altered mental status.  Secondary to vascular dementia with delirium.  Also has B12 deficiency. Increase Seroquel to 25 mg oral twice a day for increased agitation.  Continue to monitor, patient still needed a sitter last night.  Will be able to discharge tomorrow.  3.  Vitamin B12 deficiency anemia. We will give a dose of B12 injection today, start oral tomorrow.  4.  Frequent falls. Continue physical therapy.  5.  Rib fractures. Follow.  6.  CKD stage III. Follow-up  7.  Essential hypertension. Continue to monitor.  8. Pressure ulcers POA.   9. Skin bruises. Bilateral arms and legs. No deep wounds.     DVT prophylaxis: Hepatin SQ Code Status: DNR Family Communication: None  Disposition Plan:   Patient  came from: Assisted living                                                                                                                         Anticipated d/c place: SNF  Barriers to d/c OR conditions which need to be met to effect a safe d/c:   Consultants:   None  Procedures: None Antimicrobials: Vancomycin   Subjective: Patient was very agitated and combative earlier last night, was able to sleep later. Still very confused today, less agitated. No short of breath or cough. No diarrhea or constipation.  No abdominal pain or nausea vomiting.  Objective: Vitals:   09/20/19 0311 09/20/19 0900 09/20/19 1600 09/20/19 2031  BP:  (!) 123/51 125/62   Pulse:  83    Resp:  (!) 21 20   Temp:  97.8 F (36.6 C) 97.9 F (36.6 C)   TempSrc:  Tympanic Oral   SpO2:  95% 95% 93%  Weight: 48 kg     Height: '5\' 1"'$  (1.549 m)      No intake or output data in the 24 hours ending 09/21/19 0915 Filed Weights   09/19/19 1433 09/20/19 0311  Weight: 48 kg 48 kg    Examination:  General exam: Appears calm and comfortable, chronically ill. Respiratory system: Clear to auscultation. Respiratory effort normal. Cardiovascular system: S1 & S2 heard, RRR. No JVD, murmurs, rubs, gallops or clicks. No pedal edema. Gastrointestinal system: Abdomen is nondistended, soft and nontender. No organomegaly or masses felt. Normal bowel sounds heard. Central nervous system: Alert and confused. No focal neurological deficits. Extremities: Left leg redness improving. Skin: No rashes, lesions or ulcers Psychiatry: Judgement and insight appear normal. Mood & affect appropriate.     Data Reviewed: I have personally reviewed following labs and imaging studies  CBC: Recent Labs  Lab 09/19/19 1446 09/20/19 0440  WBC 12.8*  12.7* 12.1*  NEUTROABS 11.7* 8.3*  HGB 10.0*  10.0* 10.8*  HCT 33.7*  33.5* 36.1  MCV 88.0  87.7 87.8  PLT 225  220 144   Basic Metabolic Panel: Recent Labs  Lab  09/19/19 1446 09/20/19 0440  NA 140 142  K 4.1 4.0  CL 103 103  CO2 29 29  GLUCOSE 125* 86  BUN 33* 25*  CREATININE 1.09* 0.90  CALCIUM 8.8* 9.1  MG  --  2.0   GFR: Estimated Creatinine Clearance: 26.3 mL/min (by C-G formula based on SCr of 0.9 mg/dL). Liver Function Tests: Recent Labs  Lab 09/19/19 1446 09/20/19 0440  AST 14* 17  ALT 13 11  ALKPHOS 53 52  BILITOT 0.7 0.7  PROT 5.9* 5.9*  ALBUMIN 3.7 3.5   No results for input(s): LIPASE, AMYLASE in the last 168 hours. No results for input(s): AMMONIA in the last 168 hours. Coagulation Profile: Recent Labs  Lab 09/19/19 1446  INR 1.0   Cardiac Enzymes: No results for input(s): CKTOTAL, CKMB, CKMBINDEX, TROPONINI in the last 168 hours. BNP (last 3 results) No results for input(s): PROBNP in the last 8760 hours. HbA1C: No results for input(s): HGBA1C in the last 72 hours. CBG: No results for input(s): GLUCAP in the last 168 hours. Lipid Profile: No results for input(s): CHOL, HDL, LDLCALC, TRIG, CHOLHDL, LDLDIRECT in the last 72 hours. Thyroid Function Tests: Recent Labs    09/19/19 2235  TSH 3.158   Anemia Panel: Recent Labs    09/19/19 2235  VITAMINB12 166*   Sepsis Labs: Recent Labs  Lab 09/19/19 1720  LATICACIDVEN 1.0    Recent Results (from the past 240 hour(s))  SARS Coronavirus 2 by RT PCR (hospital order, performed in [facility] hospital lab) Nasopharyngeal Nasopharyngeal Swab     Status: None   Collection Time: 09/20/19 12:54 AM   Specimen: Nasopharyngeal Swab  Result Value Ref Range Status   SARS Coronavirus 2 NEGATIVE NEGATIVE Final    Comment: (NOTE) SARS-CoV-2 target nucleic acids are NOT DETECTED. The SARS-CoV-2 RNA is generally detectable in upper and lower respiratory specimens during the acute phase of infection. The lowest concentration of SARS-CoV-2 viral copies this assay can detect is 250 copies / mL. A negative result does not preclude SARS-CoV-2 infection and should not  be used as the sole basis for treatment or other patient management decisions.  A negative result may occur with improper specimen collection / handling, submission of specimen other than nasopharyngeal swab, presence of viral mutation(s) within the areas targeted by this assay, and inadequate number  of viral copies (<250 copies / mL). A negative result must be combined with clinical observations, patient history, and epidemiological information. Fact Sheet for Patients:   StrictlyIdeas.no Fact Sheet for Healthcare Providers: BankingDealers.co.za This test is not yet approved or cleared  by the [place] FDA and has been authorized for detection and/or diagnosis of SARS-CoV-2 by FDA under an Emergency Use Authorization (EUA).  This EUA will remain in effect (meaning this test can be used) for the duration of the COVID-19 declaration under Section 564(b)(1) of the Act, 21 U.S.C. section 360bbb-3(b)(1), unless the authorization is terminated or revoked sooner. Performed at [facility], [address] [place], [place] [address]          Radiology Studies: CT Head Wo Contrast  Result Date: 09/19/2019 CLINICAL DATA:  Head trauma, found down post fall, frontal bruise, confusion EXAM: CT HEAD WITHOUT CONTRAST TECHNIQUE: Contiguous axial images were obtained from the base of the skull through the vertex without intravenous contrast. Sagittal and coronal MPR images reconstructed from axial data set. COMPARISON:  08/24/2019 FINDINGS: Brain: Generalized atrophy with mild ex vacuo dilatation of the ventricular system. No midline shift or mass effect. Small vessel chronic ischemic changes of deep cerebral white matter. No intracranial hemorrhage, mass lesion or evidence of acute infarction. No extra-axial fluid collections. Vascular: Atherosclerotic calcification of internal carotid arteries at skull base Skull: Demineralized but intact  Sinuses/Orbits: Partial opacification of ethmoid air cells bilaterally as well as inferior RIGHT mastoid air cells, unchanged. Nasal septal deviation to the LEFT. Other: N/A IMPRESSION: Atrophy with small vessel chronic ischemic changes of deep cerebral white matter. No acute intracranial abnormalities. Mild chronic sinus changes as above. Electronically Signed   By: Lavonia Dana M.D.   On: 09/19/2019 17:06   DG Chest Portable 1 View  Result Date: 09/19/2019 CLINICAL DATA:  Altered mental status. Fall. History of asthma and smoking. EXAM: PORTABLE CHEST 1 VIEW COMPARISON:  None. FINDINGS: Heart size is normal. Lungs are free of focal consolidations. There is a small LEFT pleural effusion. Opacity at the LATERAL LEFT lung base may be related to atelectasis or scarring. There is deformity of LATERAL LEFT ribs, including the LEFT 6, 7, 8 ribs, adjacent to the region of the LEFT pleural and parenchymal changes. Findings are favored to represent acute fractures but could be chronic. No pneumothorax. IMPRESSION: 1. LEFT LOWER lobe atelectasis, contusion, or scarring and small LEFT effusion. 2. Suspect acute fractures of the LEFT LATERAL ribs 6, 7, and 8. Electronically Signed   By: Nolon Nations M.D.   On: 09/19/2019 15:53        Scheduled Meds: . acetaminophen  650 mg Oral BID  . cyanocobalamin  1,000 mcg Intramuscular Once  . heparin  5,000 Units Subcutaneous Q8H  . montelukast  10 mg Oral QHS  . pantoprazole  40 mg Oral Daily  . predniSONE  10 mg Oral q morning - 10a  . psyllium  1 packet Oral Daily  . QUEtiapine  25 mg Oral q1800   Continuous Infusions: . vancomycin       LOS: 1 day    Time spent: 28 minutes    Sharen Hones, MD [facility]   To contact the attending provider between 7A-7P or the covering provider during after hours 7P-7A, please log into the web site [email or web address] and access using universal [place] password for that web site. If you do not have the  password, please call the hospital operator.  09/21/2019, 9:15  AM    

## 2019-09-22 DIAGNOSIS — D513 Other dietary vitamin B12 deficiency anemia: Secondary | ICD-10-CM

## 2019-09-22 LAB — SARS CORONAVIRUS 2 (TAT 6-24 HRS): SARS Coronavirus 2: NEGATIVE

## 2019-09-22 MED ORDER — DOXYCYCLINE HYCLATE 100 MG PO TABS
100.0000 mg | ORAL_TABLET | Freq: Two times a day (BID) | ORAL | Status: DC
  Administered 2019-09-22 – 2019-09-23 (×3): 100 mg via ORAL
  Filled 2019-09-22 (×3): qty 1

## 2019-09-22 NOTE — Progress Notes (Signed)
PROGRESS NOTE    Theresa Luna  UXY:[id number] DOB: 1918-06-24 DOA: 09/19/2019 PCP: Joseph Berkshire, MD   Chief complaint.  Delirium. Brief Narrative:  Patient is a 86 year old female with history of asthma, vascular dementia who currently lives in assisted living facility who present to the hospital with frequent falls. She also had altered mental status for the last 3 to 4 weeks, she had increased confusion, irritation and agitation. Patient also has poor appetite. Upon arrival in the emergency room, she was found to haveleftlower extremity cellulitis.  6/7.  Patient was started on Seroquel 25 mg every evening last night.  She was very agitated early last night, but was able to sleep half the night later.  Continued Seroquel.  Increase to 25 mg twice a day.  6/8.  Patient still requiring a sitter earlier this morning due to agitation and behavior issue.  We will try to remove sitter today.  May transfer to [facility] when she is sitter free for 24 hours.  Assessment & Plan:   Active Problems:   Altered mental status   Pressure injury of skin   Left leg cellulitis   Vascular dementia ([place])   Vitamin B12 deficiency anemia   Acute metabolic encephalopathy  #1.  Left leg cellulitis. Condition much improved.  Discontinue vancomycin, changed to doxycycline for additional 5 days.  2.  Altered mental status.  Most likely due to vascular dementia with delirium.  Also has some B12 deficiency.  We will continue Seroquel 25 mg twice a day.  We will try to remove the sitter today, she may be transferred to nursing home if she is sitter-free for 24 hours.  3.  Vitamin B12 deficient anemia. Received B12 injection, continue oral B12 supplements.  4.  Frequent falls. Continue physical therapy.  5.  Rib fractures. No significant pain, will follow.  6.  Chronic kidney disease stage IIIa. Follow-up  7.  Essential hypertension. Continue to monitor.   8.  Skin bruises.  Bilateral arms and  legs. There is no significant pressure ulcers.   DVT prophylaxis:Hepatin SQ Code Status:DNR Family Communication:None  Disposition Plan:  Patient came from:Assisted living  Anticipated d/c place:SNF  Barriers to d/c OR conditions which need to be met to effect a safe d/c: Possible discharge to SNF tomorrow if she is sitter-free for 24 hours.  Consultants:  None  Procedures:None Antimicrobials:Doxycycline   Subjective: Patient slept most the time last night.  She woke up at 5 AM, was angry, agitated, cursing the nurses.  Then she fell back to sleep again. She is still confused this morning, appears to be at baseline.  She was angry and irritated.  States that that she could not understand a word I said due to my poor language scale. Otherwise, she does not have any short of breath or cough. She does not have any abdominal pain or nausea vomiting or diarrhea.   Objective: Vitals:   09/21/19 2012 09/22/19 0001 09/22/19 0602 09/22/19 0841  BP: (!) 114/49 (!) 138/41 (!) 146/81 (!) 162/69  Pulse: 93 78 77 87  Resp: 18 18 17    Temp: 98.7 F (37.1 C) 97.9 F (36.6 C) 97.9 F (36.6 C) 98.7 F (37.1 C)  TempSrc: Oral   Oral  SpO2: 93% 92% 93% 94%  Weight:      Height:       No intake or output data in the 24 hours ending 09/22/19 0908 Filed Weights   09/19/19 1433 09/20/19 0311  Weight: 48 kg  48 kg    Examination:  General exam: Appears calm and comfortable  Respiratory system: Clear to auscultation. Respiratory effort normal. Cardiovascular system: S1 & S2 heard, RRR. No JVD, murmurs, rubs, gallops or clicks. No pedal edema. Gastrointestinal system: Abdomen is nondistended, soft and nontender. No organomegaly or masses felt. Normal bowel sounds heard. Central nervous system: Alert and oriented x1 with occasional agitation. No focal  neurological deficits. Extremities: Symmetric . Skin: Left leg redness appeared to be more chronic. Psychiatry: Confusion patient agitation.  No hallucination.    Data Reviewed: I have personally reviewed following labs and imaging studies  CBC: Recent Labs  Lab 09/19/19 1446 09/19/19 2235 09/20/19 0440  WBC 12.8*  12.7*  --  12.1*  NEUTROABS 11.7*  --  8.3*  HGB 10.0*  10.0*  --  10.8*  HCT 33.7*  33.5* 35.8 36.1  MCV 88.0  87.7  --  87.8  PLT 225  220  --  161   Basic Metabolic Panel: Recent Labs  Lab 09/19/19 1446 09/20/19 0440  NA 140 142  K 4.1 4.0  CL 103 103  CO2 29 29  GLUCOSE 125* 86  BUN 33* 25*  CREATININE 1.09* 0.90  CALCIUM 8.8* 9.1  MG  --  2.0   GFR: Estimated Creatinine Clearance: 26.3 mL/min (by C-G formula based on SCr of 0.9 mg/dL). Liver Function Tests: Recent Labs  Lab 09/19/19 1446 09/20/19 0440  AST 14* 17  ALT 13 11  ALKPHOS 53 52  BILITOT 0.7 0.7  PROT 5.9* 5.9*  ALBUMIN 3.7 3.5   No results for input(s): LIPASE, AMYLASE in the last 168 hours. No results for input(s): AMMONIA in the last 168 hours. Coagulation Profile: Recent Labs  Lab 09/19/19 1446  INR 1.0   Cardiac Enzymes: No results for input(s): CKTOTAL, CKMB, CKMBINDEX, TROPONINI in the last 168 hours. BNP (last 3 results) No results for input(s): PROBNP in the last 8760 hours. HbA1C: No results for input(s): HGBA1C in the last 72 hours. CBG: No results for input(s): GLUCAP in the last 168 hours. Lipid Profile: No results for input(s): CHOL, HDL, LDLCALC, TRIG, CHOLHDL, LDLDIRECT in the last 72 hours. Thyroid Function Tests: Recent Labs    09/19/19 2235  TSH 3.158   Anemia Panel: Recent Labs    09/19/19 2235  VITAMINB12 166*   Sepsis Labs: Recent Labs  Lab 09/19/19 1720  LATICACIDVEN 1.0    Recent Results (from the past 240 hour(s))  SARS Coronavirus 2 by RT PCR (hospital order, performed in [facility] hospital lab) Nasopharyngeal  Nasopharyngeal Swab     Status: None   Collection Time: 09/20/19 12:54 AM   Specimen: Nasopharyngeal Swab  Result Value Ref Range Status   SARS Coronavirus 2 NEGATIVE NEGATIVE Final    Comment: (NOTE) SARS-CoV-2 target nucleic acids are NOT DETECTED. The SARS-CoV-2 RNA is generally detectable in upper and lower respiratory specimens during the acute phase of infection. The lowest concentration of SARS-CoV-2 viral copies this assay can detect is 250 copies / mL. A negative result does not preclude SARS-CoV-2 infection and should not be used as the sole basis for treatment or other patient management decisions.  A negative result may occur with improper specimen collection / handling, submission of specimen other than nasopharyngeal swab, presence of viral mutation(s) within the areas targeted by this assay, and inadequate number of viral copies (<250 copies / mL). A negative result must be combined with clinical observations, patient history, and epidemiological information. Fact Sheet  for Patients:   StrictlyIdeas.no Fact Sheet for Healthcare Providers: BankingDealers.co.za This test is not yet approved or cleared  by the [place] FDA and has been authorized for detection and/or diagnosis of SARS-CoV-2 by FDA under an Emergency Use Authorization (EUA).  This EUA will remain in effect (meaning this test can be used) for the duration of the COVID-19 declaration under Section 564(b)(1) of the Act, 21 U.S.C. section 360bbb-3(b)(1), unless the authorization is terminated or revoked sooner. Performed at [facility], [address], [place],  [address]          Radiology Studies: No results found.      Scheduled Meds: . acetaminophen  650 mg Oral BID  . cyanocobalamin  1,000 mcg Intramuscular Once  . doxycycline  100 mg Oral Q12H  . heparin  5,000 Units Subcutaneous Q8H  . montelukast  10 mg Oral QHS  .  pantoprazole  40 mg Oral Daily  . predniSONE  10 mg Oral q morning - 10a  . psyllium  1 packet Oral Daily  . QUEtiapine  25 mg Oral BID  . tiotropium  18 mcg Inhalation Daily  . vitamin B-12  1,000 mcg Oral Daily   Continuous Infusions:   LOS: 2 days    Time spent: 35 minutes    Sharen Hones, MD [facility]   To contact the attending provider between 7A-7P or the covering provider during after hours 7P-7A, please log into the web site [email or web address] and access using universal [place] password for that web site. If you do not have the password, please call the hospital operator.  09/22/2019, 9:08 AM

## 2019-09-22 NOTE — TOC Progression Note (Signed)
Transition of Care [facility]) - Progression Note    Patient Details  Name: Theresa Luna MRN: [id number] Date of Birth: 1919/03/23  [facility] CM/SW Contact  Marbin Olshefski, Gardiner Rhyme, LCSW Phone Number: 09/22/2019, 1:02 PM  Clinical Narrative:   Met with pt and daughter who was in the room to discuss discharge plan. Pt went between telling her daughter to stay out if it to telling her: " Do what you want." Discussed having a bed at [facility] and this is daughter's first choice have accepted bed. They want daughter to talk with and complete paperwork but want to make sure pt's behaviors are better and seroquel is working. Have asked MD to speak with pt and daughter through secure chat. Pt seems to nbeed memory care once SNF is completed and daughter is aware of this and looking into this also. Will need new COVID test for pt to transfer to SNF. Will work on transfer for tomorrow.         Expected Discharge Plan and Services                                                 Social Determinants of Health (SDOH) Interventions    Readmission Risk Interventions No flowsheet data found.

## 2019-09-22 NOTE — Progress Notes (Signed)
Physical Therapy Treatment Patient Details Name: Theresa Luna MRN: [id number] DOB: Mar 10, 1919 Today's Date: 09/22/2019    History of Present Illness Pt admitted for AMS s/p fall and confusion. History includes dementia and asthma. According to chart, pt has been declining past 3-4 weeks. Attempted to call daughter for more accurate history, however no answer at this time. Of note, possible acute vs chronic rib fx on 6,7,8 and L leg cellulitis. Pt declines pain in either area at this time.    PT Comments    Nursing in room for care upon arrival.  Assisted with care and full bed change due to inc urine and possible spilled liquids in bed.  She is able to roll left/rigth with min a x 1.  Pt generally agitated during session which limits her participation at this time.  Will continue as appropriate.   Follow Up Recommendations  SNF     Equipment Recommendations       Recommendations for Other Services       Precautions / Restrictions Precautions Precautions: Fall Precaution Comments: combative at times.  confused Restrictions Weight Bearing Restrictions: No    Mobility  Bed Mobility Overal bed mobility: Needs Assistance Bed Mobility: Rolling Rolling: Min assist         General bed mobility comments: deferred  Transfers                    Ambulation/Gait                 Stairs             Wheelchair Mobility    Modified Rankin (Stroke Patients Only)       Balance                                            Cognition Arousal/Alertness: Awake/alert Behavior During Therapy: Agitated;Restless Overall Cognitive Status: No family/caregiver present to determine baseline cognitive functioning                                 General Comments: agitated with care stating multiple times how badly she is being treated and that we have not provided care since she has been here.      Exercises Other Exercises Other  Exercises: linen change due to inc urine    General Comments        Pertinent Vitals/Pain Pain Assessment: No/denies pain    Home Living                      Prior Function            PT Goals (current goals can now be found in the care plan section) Progress towards PT goals: Progressing toward goals    Frequency    Min 2X/week      PT Plan Current plan remains appropriate    Co-evaluation              AM-PAC PT "6 Clicks" Mobility   Outcome Measure  Help needed turning from your back to your side while in a flat bed without using bedrails?: A Little Help needed moving from lying on your back to sitting on the side of a flat bed without using bedrails?: A Little Help needed moving to and from a bed  to a chair (including a wheelchair)?: A Lot Help needed standing up from a chair using your arms (e.g., wheelchair or bedside chair)?: A Lot Help needed to walk in hospital room?: A Lot Help needed climbing 3-5 steps with a railing? : Total 6 Click Score: 13    End of Session Equipment Utilized During Treatment: Gait belt Activity Tolerance: Treatment limited secondary to agitation Patient left: in bed;with call bell/phone within reach;with bed alarm set Nurse Communication: Mobility status       Time: 4599-7741 PT Time Calculation (min) (ACUTE ONLY): 33 min  Charges:  $Therapeutic Activity: 23-37 mins                    Chesley Noon, PTA 09/22/19, 10:01 AM

## 2019-09-23 ENCOUNTER — Encounter: Payer: Self-pay | Admitting: Internal Medicine

## 2019-09-23 DIAGNOSIS — D519 Vitamin B12 deficiency anemia, unspecified: Secondary | ICD-10-CM

## 2019-09-23 LAB — CBC
HCT: 34.1 % — ABNORMAL LOW (ref 36.0–46.0)
Hemoglobin: 10.2 g/dL — ABNORMAL LOW (ref 12.0–15.0)
MCH: 26.4 pg (ref 26.0–34.0)
MCHC: 29.9 g/dL — ABNORMAL LOW (ref 30.0–36.0)
MCV: 88.3 fL (ref 80.0–100.0)
Platelets: 222 10*3/uL (ref 150–400)
RBC: 3.86 MIL/uL — ABNORMAL LOW (ref 3.87–5.11)
RDW: 17.2 % — ABNORMAL HIGH (ref 11.5–15.5)
WBC: 9.4 10*3/uL (ref 4.0–10.5)
nRBC: 0 % (ref 0.0–0.2)

## 2019-09-23 MED ORDER — QUETIAPINE FUMARATE 25 MG PO TABS: 25.0000 mg | ORAL_TABLET | Freq: Two times a day (BID) | ORAL | 0 refills | Status: DC

## 2019-09-23 MED ORDER — CYANOCOBALAMIN 1000 MCG PO TABS: 1000.0000 ug | ORAL_TABLET | Freq: Every day | ORAL | Status: AC

## 2019-09-23 MED ORDER — HYDROCHLOROTHIAZIDE 25 MG PO TABS
25.0000 mg | ORAL_TABLET | Freq: Once | ORAL | Status: AC
  Administered 2019-09-23: 14:00:00 25 mg via ORAL
  Filled 2019-09-23: qty 1

## 2019-09-23 NOTE — Progress Notes (Signed)
Report called to [facility]. IV removed, cath intact. Patients daughter in room. Patient D/C via transport.

## 2019-09-23 NOTE — TOC Transition Note (Signed)
Transition of Care [facility]) - CM/SW Discharge Note   Patient Details  Name: Theresa Luna MRN: [id number] Date of Birth: Aug 14, 1918  [facility]) CM/SW Contact:  Elease Hashimoto, LCSW Phone Number: 09/23/2019, 1:04 PM   Clinical Narrative:  Met with pt and daughter medically ready to transfer to [facility] today. Daughter has been there and completed paperwork for admission. DC packet in chart and bedside RN to call report to [phone]. Daughter will be able to spend the night at facility to assist with pt's transition to the facility. Pt is aware of the plan and so far is agreeable. [facility] to transport to [facility]. All paperwork completed and ready for transport.    Final next level of care: [place]     Patient Goals and CMS Choice   CMS Medicare.gov Compare Post Acute Care list provided to:: Patient Represenative (must comment)(daughter-POA)    Discharge Placement PASRR number recieved: 09/20/19            Patient chooses bed at: [place] Patient to be transferred to facility by: [facility] Name of family member notified: Melissa-daughter Patient and family notified of of transfer: 09/23/19  Discharge Plan and Services                                     Social Determinants of Health (SDOH) Interventions     Readmission Risk Interventions No flowsheet data found.

## 2019-09-23 NOTE — Discharge Summary (Addendum)
Physician Discharge Summary  Theresa Luna JME:[id number] DOB: 1918/07/09 DOA: 09/19/2019  PCP: Joseph Berkshire, MD  Admit date: 09/19/2019 Discharge date: 09/23/2019  Discharge disposition: SNF   Recommendations for Outpatient Follow-Up:   Outpatient follow-up with PCP   Discharge Diagnosis:   Active Problems:   Altered mental status   Pressure injury of skin   Left leg cellulitis   Vascular dementia ([place])   Vitamin B12 deficiency anemia   Acute metabolic encephalopathy    Discharge Condition: Stable.  Diet recommendation: Low-salt diet  Code status: DNR.    Hospital Course:   Ms. Theresa Luna is a 86 year old female with history of asthma, vascular dementia  who presented to the hospital from assisted living facility with frequent falls. She also had altered mental status for about 3 to 4 weeks prior to admission. She had increased confusion, irritation and agitation. Patient also had poor appetite. Upon arrival in the emergency room, she was found to haveleftlower extremity cellulitis. She was treated with empiric IV antibiotics and left leg cellulitis has resolved.  Work-up also showed vitamin B12 deficiency and so she was started on vitamin B12 injections.  Chest x-ray showed suspected acute fractures of the left lateral ribs 6, 7 and 8.  She was given Seroquel for intermittent episodes of agitation.  Overall, her condition has improved.  She was evaluated by PT and OT who recommended further rehabilitation at a skilled nursing facility.    Discharge Exam:   Vitals:   09/22/19 1955 09/23/19 0815  BP: (!) 156/63 (!) 187/64  Pulse: 87 80  Resp: 16 14  Temp:  98.2 F (36.8 C)  SpO2: 94% 95%   Vitals:   09/22/19 1028 09/22/19 1557 09/22/19 1955 09/23/19 0815  BP:  (!) 122/52 (!) 156/63 (!) 187/64  Pulse:  87 87 80  Resp:  20 16 14   Temp:  98.4 F (36.9 C) 98.1 F (36.7 C) 98.2 F (36.8 C)  TempSrc:  Oral Oral   SpO2: 95%  94% 95%  Weight:        Height:         GEN: NAD SKIN: Multiple bruises on bilateral legs, superficial wound on the right lateral leg EYES: EOMI ENT: MMM CV: RRR PULM: CTA B ABD: soft, ND, NT, +BS CNS: AAO x 2 (person and place), non focal EXT: No edema or tenderness   The results of significant diagnostics from this hospitalization (including imaging, microbiology, ancillary and laboratory) are listed below for reference.     Procedures and Diagnostic Studies:   CT Head Wo Contrast  Result Date: 09/19/2019 CLINICAL DATA:  Head trauma, found down post fall, frontal bruise, confusion EXAM: CT HEAD WITHOUT CONTRAST TECHNIQUE: Contiguous axial images were obtained from the base of the skull through the vertex without intravenous contrast. Sagittal and coronal MPR images reconstructed from axial data set. COMPARISON:  08/24/2019 FINDINGS: Brain: Generalized atrophy with mild ex vacuo dilatation of the ventricular system. No midline shift or mass effect. Small vessel chronic ischemic changes of deep cerebral white matter. No intracranial hemorrhage, mass lesion or evidence of acute infarction. No extra-axial fluid collections. Vascular: Atherosclerotic calcification of internal carotid arteries at skull base Skull: Demineralized but intact Sinuses/Orbits: Partial opacification of ethmoid air cells bilaterally as well as inferior RIGHT mastoid air cells, unchanged. Nasal septal deviation to the LEFT. Other: N/A IMPRESSION: Atrophy with small vessel chronic ischemic changes of deep cerebral white matter. No acute intracranial abnormalities. Mild chronic sinus changes as above.  Electronically Signed   By: Theresa Luna M.D.   On: 09/19/2019 17:06   DG Chest Portable 1 View  Result Date: 09/19/2019 CLINICAL DATA:  Altered mental status. Fall. History of asthma and smoking. EXAM: PORTABLE CHEST 1 VIEW COMPARISON:  None. FINDINGS: Heart size is normal. Lungs are free of focal consolidations. There is a small LEFT pleural  effusion. Opacity at the LATERAL LEFT lung base may be related to atelectasis or scarring. There is deformity of LATERAL LEFT ribs, including the LEFT 6, 7, 8 ribs, adjacent to the region of the LEFT pleural and parenchymal changes. Findings are favored to represent acute fractures but could be chronic. No pneumothorax. IMPRESSION: 1. LEFT LOWER lobe atelectasis, contusion, or scarring and small LEFT effusion. 2. Suspect acute fractures of the LEFT LATERAL ribs 6, 7, and 8. Electronically Signed   By: Norva Pavlov M.D.   On: 09/19/2019 15:53     Labs:   Basic Metabolic Panel: Recent Labs  Lab 09/19/19 1446 09/20/19 0440  NA 140 142  K 4.1 4.0  CL 103 103  CO2 29 29  GLUCOSE 125* 86  BUN 33* 25*  CREATININE 1.09* 0.90  CALCIUM 8.8* 9.1  MG  --  2.0   GFR Estimated Creatinine Clearance: 26.3 mL/min (by C-G formula based on SCr of 0.9 mg/dL). Liver Function Tests: Recent Labs  Lab 09/19/19 1446 09/20/19 0440  AST 14* 17  ALT 13 11  ALKPHOS 53 52  BILITOT 0.7 0.7  PROT 5.9* 5.9*  ALBUMIN 3.7 3.5   No results for input(s): LIPASE, AMYLASE in the last 168 hours. No results for input(s): AMMONIA in the last 168 hours. Coagulation profile Recent Labs  Lab 09/19/19 1446  INR 1.0    CBC: Recent Labs  Lab 09/19/19 1446 09/19/19 2235 09/20/19 0440 09/23/19 0542  WBC 12.8*  12.7*  --  12.1* 9.4  NEUTROABS 11.7*  --  8.3*  --   HGB 10.0*  10.0*  --  10.8* 10.2*  HCT 33.7*  33.5* 35.8 36.1 34.1*  MCV 88.0  87.7  --  87.8 88.3  PLT 225  220  --  246 222   Cardiac Enzymes: No results for input(s): CKTOTAL, CKMB, CKMBINDEX, TROPONINI in the last 168 hours. BNP: Invalid input(s): POCBNP CBG: No results for input(s): GLUCAP in the last 168 hours. D-Dimer No results for input(s): DDIMER in the last 72 hours. Hgb A1c No results for input(s): HGBA1C in the last 72 hours. Lipid Profile No results for input(s): CHOL, HDL, LDLCALC, TRIG, CHOLHDL, LDLDIRECT in the  last 72 hours. Thyroid function studies No results for input(s): TSH, T4TOTAL, T3FREE, THYROIDAB in the last 72 hours.  Invalid input(s): FREET3 Anemia work up No results for input(s): VITAMINB12, FOLATE, FERRITIN, TIBC, IRON, RETICCTPCT in the last 72 hours. Microbiology Recent Results (from the past 240 hour(s))  SARS Coronavirus 2 by RT PCR (hospital order, performed in [facility] hospital lab) Nasopharyngeal Nasopharyngeal Swab     Status: None   Collection Time: 09/20/19 12:54 AM   Specimen: Nasopharyngeal Swab  Result Value Ref Range Status   SARS Coronavirus 2 NEGATIVE NEGATIVE Final    Comment: (NOTE) SARS-CoV-2 target nucleic acids are NOT DETECTED. The SARS-CoV-2 RNA is generally detectable in upper and lower respiratory specimens during the acute phase of infection. The lowest concentration of SARS-CoV-2 viral copies this assay can detect is 250 copies / mL. A negative result does not preclude SARS-CoV-2 infection and should not be used  as the sole basis for treatment or other patient management decisions.  A negative result may occur with improper specimen collection / handling, submission of specimen other than nasopharyngeal swab, presence of viral mutation(s) within the areas targeted by this assay, and inadequate number of viral copies (<250 copies / mL). A negative result must be combined with clinical observations, patient history, and epidemiological information. Fact Sheet for Patients:   BoilerBrush.com.cy Fact Sheet for Healthcare Providers: https://pope.com/ This test is not yet approved or cleared  by the [place] FDA and has been authorized for detection and/or diagnosis of SARS-CoV-2 by FDA under an Emergency Use Authorization (EUA).  This EUA will remain in effect (meaning this test can be used) for the duration of the COVID-19 declaration under Section 564(b)(1) of the Act, 21 U.S.C. section  360bbb-3(b)(1), unless the authorization is terminated or revoked sooner. Performed at [facility], [address] [place], [place] [address]   SARS CORONAVIRUS 2 (TAT 6-24 HRS) Nasopharyngeal Nasopharyngeal Swab     Status: None   Collection Time: 09/22/19  2:27 PM   Specimen: Nasopharyngeal Swab  Result Value Ref Range Status   SARS Coronavirus 2 NEGATIVE NEGATIVE Final    Comment: (NOTE) SARS-CoV-2 target nucleic acids are NOT DETECTED. The SARS-CoV-2 RNA is generally detectable in upper and lower respiratory specimens during the acute phase of infection. Negative results do not preclude SARS-CoV-2 infection, do not rule out co-infections with other pathogens, and should not be used as the sole basis for treatment or other patient management decisions. Negative results must be combined with clinical observations, patient history, and epidemiological information. The expected result is Negative. Fact Sheet for Patients: HairSlick.no Fact Sheet for Healthcare Providers: quierodirigir.com This test is not yet approved or cleared by the [place] FDA and  has been authorized for detection and/or diagnosis of SARS-CoV-2 by FDA under an Emergency Use Authorization (EUA). This EUA will remain  in effect (meaning this test can be used) for the duration of the COVID-19 declaration under Section 56 4(b)(1) of the Act, 21 U.S.C. section 360bbb-3(b)(1), unless the authorization is terminated or revoked sooner. Performed at [facility], [address] [place], [place] [address]      Discharge Instructions:   Discharge Instructions    Diet - low sodium heart healthy   Complete by: As directed    Discharge instructions   Complete by: As directed    Follow-up with physician at the nursing home within 3 days of discharge   Discharge wound care:   Complete by: As directed    Keep right leg wound clean and dry    Increase activity slowly   Complete by: As directed      Allergies as of 09/23/2019      Reactions   Mirtazapine    Hallucinations   Penicillins Rash      Medication List    STOP taking these medications   cephALEXin 500 MG capsule Commonly known as: KEFLEX   ciprofloxacin 500 MG tablet Commonly known as: CIPRO   traMADol 50 MG tablet Commonly known as: ULTRAM     TAKE these medications   acetaminophen 500 MG tablet Commonly known as: TYLENOL Take 1,000 mg by mouth 2 (two) times daily as needed for pain.   albuterol 108 (90 Base) MCG/ACT inhaler Commonly known as: VENTOLIN HFA Inhale 2 puffs into the lungs every 6 (six) hours as needed.   albuterol (2.5 MG/3ML) 0.083% nebulizer solution Commonly known as:  PROVENTIL Inhale 2.5 mg into the lungs every 4 (four) hours as needed for wheezing.   cyanocobalamin 1000 MCG tablet Take 1 tablet (1,000 mcg total) by mouth daily. Start taking on: September 24, 2019   hydrochlorothiazide 12.5 MG tablet Commonly known as: HYDRODIURIL Take 12.5 mg by mouth daily.   montelukast 10 MG tablet Commonly known as: SINGULAIR Take 10 mg by mouth at bedtime.   omeprazole 20 MG capsule Commonly known as: PRILOSEC Take 20 mg by mouth daily at 6 (six) AM.   polyethylene glycol powder 17 GM/SCOOP powder Commonly known as: GLYCOLAX/MIRALAX Take 17 g by mouth daily at 6 (six) AM.   predniSONE 10 MG tablet Commonly known as: DELTASONE Take 10 mg by mouth daily.   QUEtiapine 25 MG tablet Commonly known as: SEROQUEL Take 1 tablet (25 mg total) by mouth 2 (two) times daily for 7 days.   Spiriva HandiHaler 18 MCG inhalation capsule Generic drug: tiotropium   traZODone 50 MG tablet Commonly known as: DESYREL Take 25 mg by mouth at bedtime.            Discharge Care Instructions  (From admission, onward)         Start     Ordered   09/23/19 0000  Discharge wound care:    Comments: Keep right leg wound clean and dry    09/23/19 1246         Contact information for after-discharge care    Destination    [facility] .   Service: Skilled Nursing Contact information: [address] [facility] [place] [address] [phone]               Time coordinating discharge: 32 minutes  Signed:  Trezure Cronk  [facility] 09/23/2019, 2:37 PM

## 2019-09-23 NOTE — TOC Progression Note (Signed)
Transition of Care [facility]) - Progression Note    Patient Details  Name: Theresa Luna MRN: [id number] Date of Birth: March 31, 1919  Transition of Care [facility]) CM/SW Contact  Maurion Walkowiak, Lemar Livings, LCSW Phone Number: 09/23/2019, 9:26 AM  Clinical Narrative:   Spoke with [facility] who reports they will try to do rehab with pt. Daughter aware of the plan and aware if does not participate in therapies will become private pay and need to be moved to long term care of memory care. Daughter to call [facility] and set up time for paperwork today so can be transferred this afternoon. Bedside RN aware and will ask MD to do DC paperwork. Work on transfer today.         Expected Discharge Plan and Services                                                 Social Determinants of Health (SDOH) Interventions    Readmission Risk Interventions No flowsheet data found.

## 2019-10-01 ENCOUNTER — Non-Acute Institutional Stay: Payer: Medicare Other | Admitting: Primary Care

## 2019-10-02 ENCOUNTER — Other Ambulatory Visit: Payer: Self-pay

## 2019-10-02 ENCOUNTER — Non-Acute Institutional Stay: Payer: Medicare Other | Admitting: Primary Care

## 2019-10-02 DIAGNOSIS — F01518 Vascular dementia, unspecified severity, with other behavioral disturbance: Secondary | ICD-10-CM

## 2019-10-02 DIAGNOSIS — Z515 Encounter for palliative care: Secondary | ICD-10-CM

## 2019-10-02 NOTE — Progress Notes (Signed)
[facility] Consult Note Telephone: [phone]  Fax: [phone]  PATIENT NAME: Theresa Luna [address] [place] [id number] (home)  DOB: 11/18/1918 MRN: [id number]  PRIMARY CARE PROVIDER:    Marisa Hua, MD [place] [id number]   REFERRING PROVIDER:   Marisa Hua, MD [place],  [place] [address]    RESPONSIBLE PARTY:   Extended Emergency Contact Information Primary Emergency Contact: [place] Phone: [phone] Mobile Phone: [phone] Relation: Daughter  I met with patient in the facility.  ASSESSMENT AND RECOMMENDATIONS:    1. Advance Care Planning/Goals of Care: Goals include to maximize quality of life and symptom management. Pt has DNR on record in Epic and at facility. Patient had been living at home prior to placement. Pt has advanced dementia with behavior disturbances.   2. 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  She had taken off a bandage on her LE wound, which I reported to floor nurse. She also ambulated with her walker, and then returned to her room and disrobed. She is on quetiapine bid now, 25 mg   3. Family /Caregiver/Community Supports: recently new resident of LTC.Daughter is POA, called,  No answer, left message.  4. Cognitive / Functional decline: A and O x 1, FAST score 7A. Able to walk with walker although needs standby assistance. Has dexterity for feeding, grooming but may require cueing.  5. Follow up Palliative Care Visit: Palliative care will continue to follow for goals of care clarification and symptom management. Return 4-6 weeks or prn.  I spent 45 minutes providing this consultation,  from 1000 to 1045. More than 50% of the time in this consultation was spent coordinating communication.    HISTORY OF PRESENT ILLNESS:  Theresa Luna is a 86 y.o. year old female with multiple medical problems including advanced dementia, falls, asthma. Palliative Care was asked to follow this patient by consultation request of Marisa Hua, MD  to help address advance care planning and goals of care. This is an initial  visit.  CODE STATUS: DNR  PPS: 50%  HOSPICE ELIGIBILITY/DIAGNOSIS: no  PAST MEDICAL HISTORY:  Past Medical History:  Diagnosis Date  . Asthma   . Bruises easily     SOCIAL HX:  Social History   Tobacco Use  . Smoking status: Former Research scientist (life sciences)  . Smokeless tobacco: Never Used  Substance Use Topics  . Alcohol use: No    ALLERGIES:  Allergies  Allergen Reactions  . Mirtazapine     Hallucinations  . Penicillins Rash     PERTINENT MEDICATIONS:  Outpatient Encounter Medications as of 10/02/2019  Medication Sig  . acetaminophen (TYLENOL) 500 MG tablet Take 1,000 mg by mouth 2 (two) times daily as needed for pain.  Marland Kitchen albuterol (PROVENTIL) (2.5 MG/3ML) 0.083% nebulizer solution Inhale 2.5 mg into the lungs every 4 (four) hours as needed for wheezing.  Marland Kitchen albuterol (VENTOLIN HFA) 108 (90 Base) MCG/ACT inhaler Inhale 2 puffs into the lungs every 6 (six) hours as needed.  . hydrochlorothiazide (HYDRODIURIL) 12.5 MG tablet Take 12.5 mg by mouth daily.  . montelukast (SINGULAIR) 10 MG tablet Take 10 mg by mouth at bedtime.  Marland Kitchen omeprazole (PRILOSEC) 20 MG capsule Take 20 mg by mouth daily at 6 (six) AM.  . polyethylene glycol powder (GLYCOLAX/MIRALAX) 17 GM/SCOOP powder  Take 17 g by mouth daily at 6 (six) AM.  . predniSONE (DELTASONE) 10 MG tablet Take 10 mg by mouth daily.  Marland Kitchen SPIRIVA HANDIHALER 18 MCG inhalation capsule   . traZODone (DESYREL) 50 MG tablet Take 25 mg by mouth at bedtime.  . vitamin B-12 1000 MCG tablet Take 1 tablet (1,000 mcg total) by mouth daily.  . [DISCONTINUED] QUEtiapine (SEROQUEL) 25 MG tablet Take 1 tablet (25 mg total) by mouth 2 (two) times  daily for 7 days.   No facility-administered encounter medications on file as of 10/02/2019.     PHYSICAL EXAM / ROS:   Current and past weights: 97 lbs General: NAD, frail appearing, thin Cardiovascular: no chest pain reported, no edema  Pulmonary: no cough, no increased SOB, room air Abdomen: appetite good, incontinent of bowel GU: denies dysuria, incontinent of urine MSK:  no joint and ROM abnormalities, ambulatory with walker Skin: Bil calf ulcers, had removed one dressing. Neurological: Weakness, advanced dementia. 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 Took off clothing and sat in recliner.   [name], NP [facility]  COVID-19 PATIENT SCREENING TOOL  Person answering questions: ____________Staff_______ _____   1.  Is the patient or any family member in the home showing any signs or symptoms regarding respiratory infection?               Person with Symptom- __________NA_________________  a. Fever                                                                          Yes___ No___          ___________________  b. Shortness of breath                                                    Yes___ No___          ___________________ c. Cough/congestion                                       Yes___  No___         ___________________ d. Body aches/pains                                                         Yes___ No___        ____________________ e. Gastrointestinal symptoms (diarrhea, nausea)           Yes___ No___        ____________________  2. Within the past 14 days, has anyone living in the home had any contact with someone with or under investigation for COVID-19?    Yes___ No_X_   Person __________________

## 2019-12-16 DEATH — deceased

## 2021-01-26 IMAGING — CT CT HEAD W/O CM
3 series · 16 of 47 positions shown, 19 images · non-contrast
Comparison: 08/24/2019

CLINICAL DATA: Head trauma, found down post fall, frontal bruise,
confusion

EXAM:
CT HEAD WITHOUT CONTRAST
TECHNIQUE: Contiguous axial images were obtained from the base of the skull
through the vertex without intravenous contrast. Sagittal and
coronal MPR images reconstructed from axial data set.

[Series 2: head wo · axial · 0.47mm/px · z∈[-127,-2]mm · 10 of 30 slices shown, 13 images]
[im 3/30  brain]
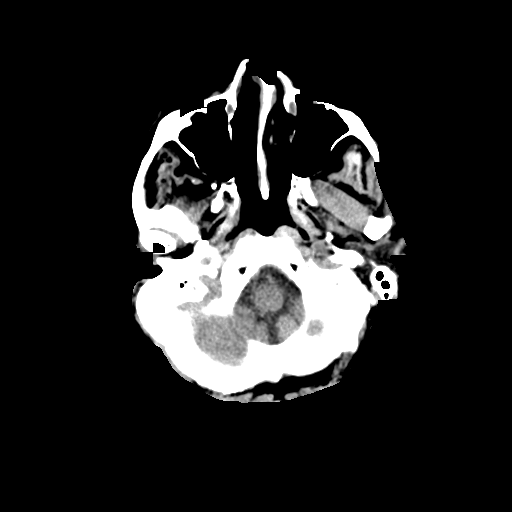
[im 3/30  bone]
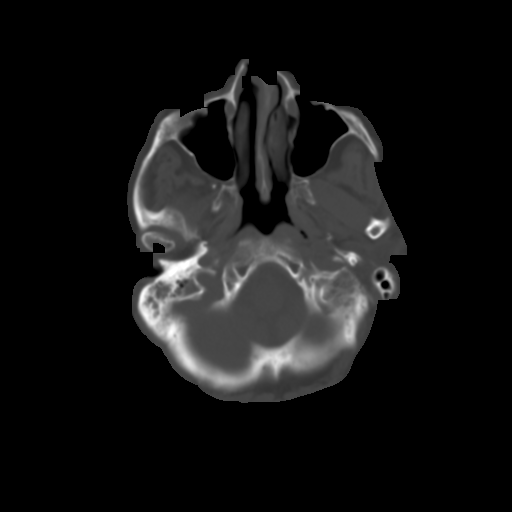
[im 6/30  brain]
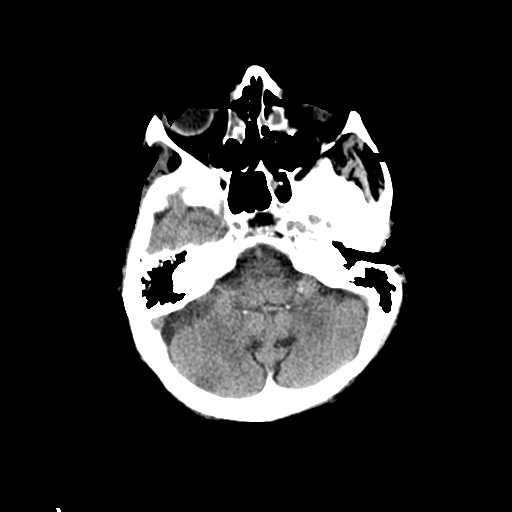
[im 9/30  brain]
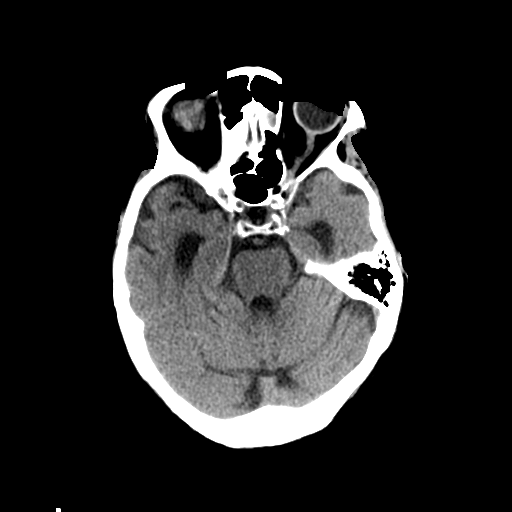
[im 11/30  brain]
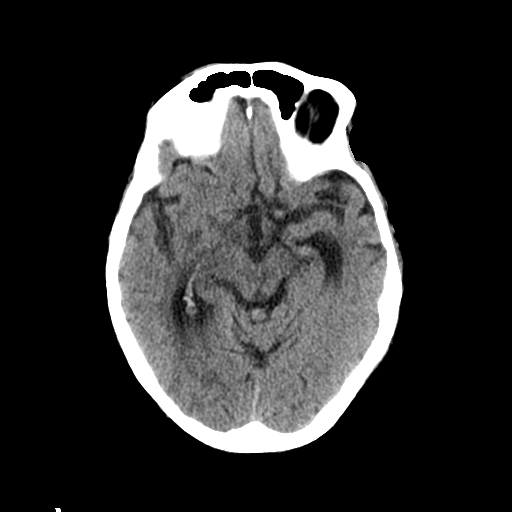
[im 14/30  brain]
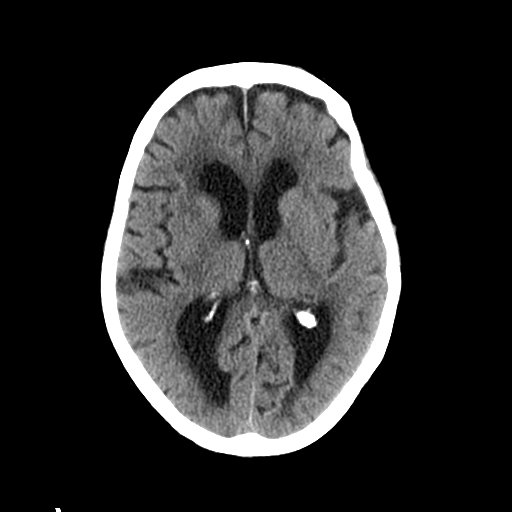
[im 14/30  bone]
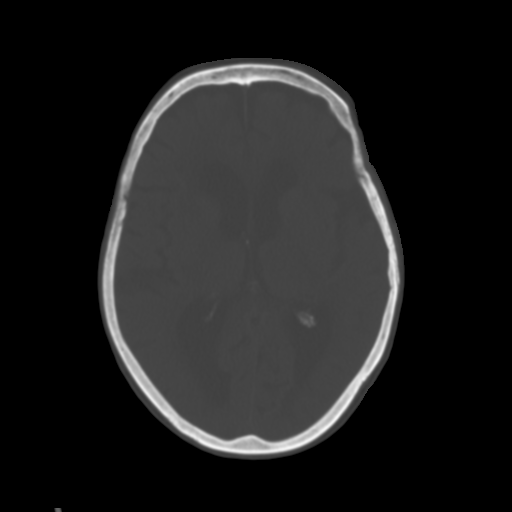
[im 17/30  brain]
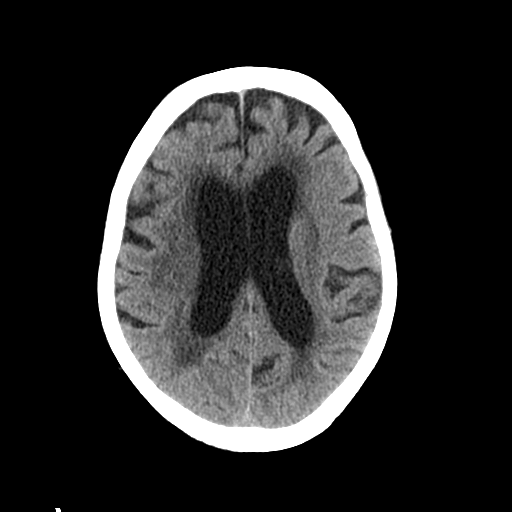
[im 20/30  brain]
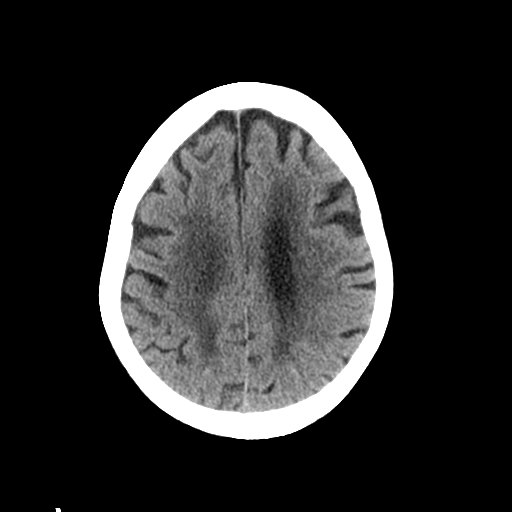
[im 23/30  brain]
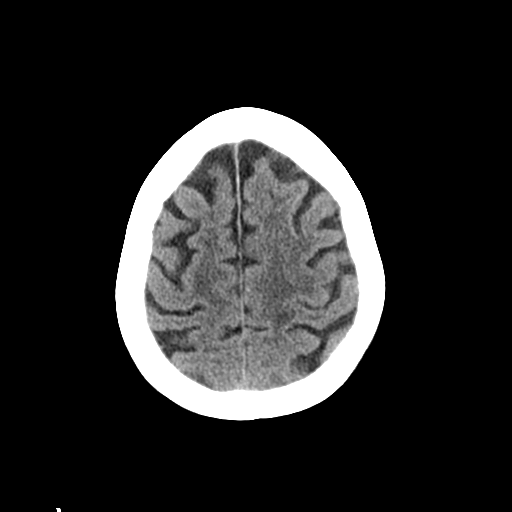
[im 25/30  brain]
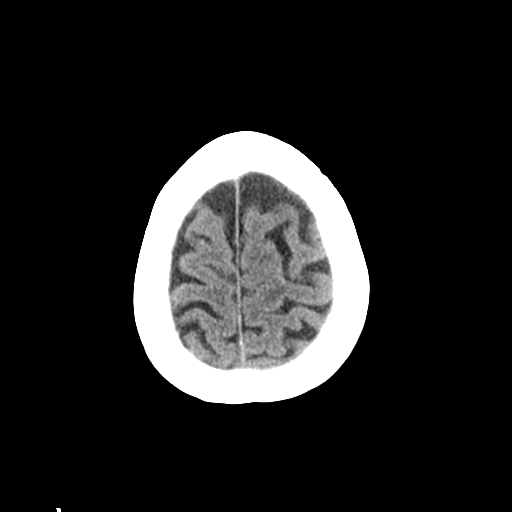
[im 25/30  bone]
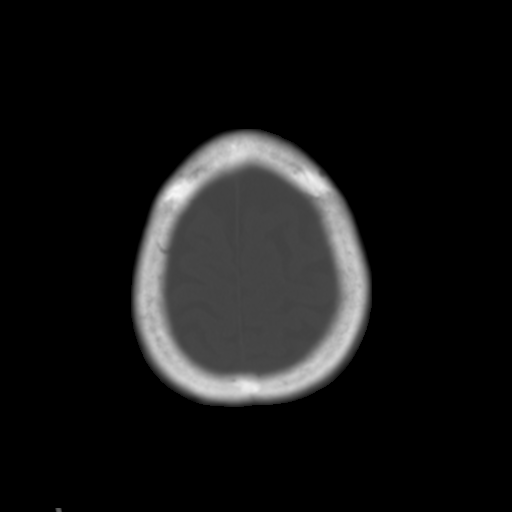
[im 28/30  brain]
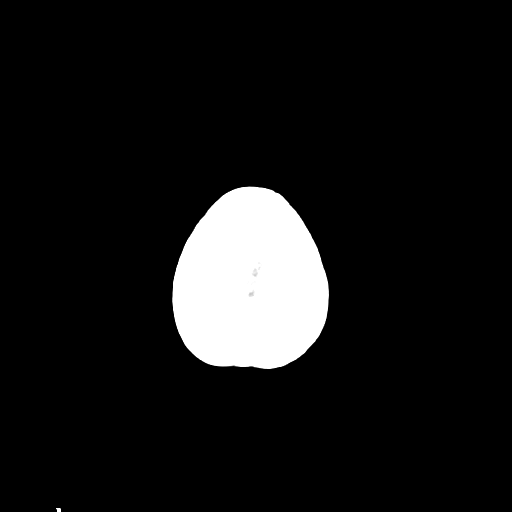

[Series 4: coronal soft tissue · coronal · 0.29mm/px · 3 of 65 slices shown]
[im 22/65  brain]
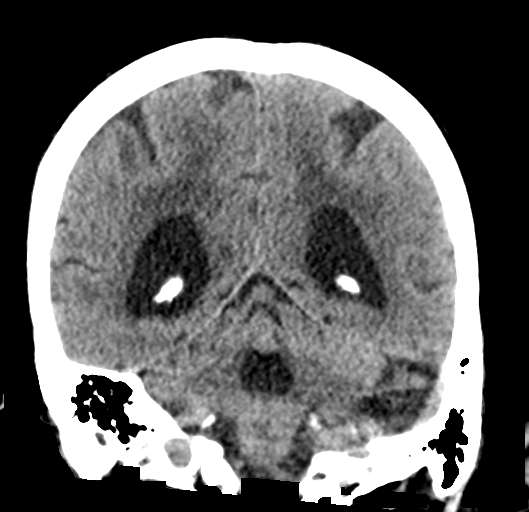
[im 29/65  brain]
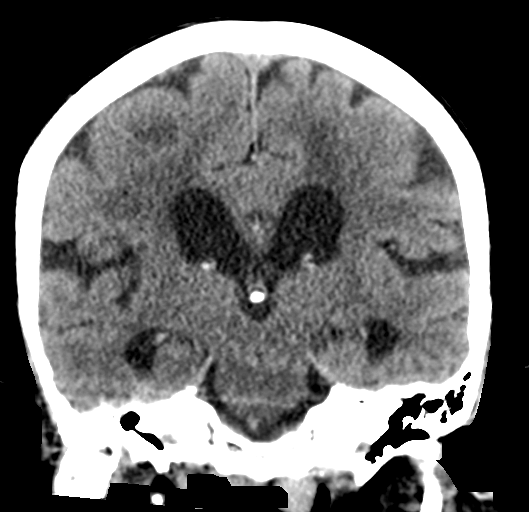
[im 36/65  brain]
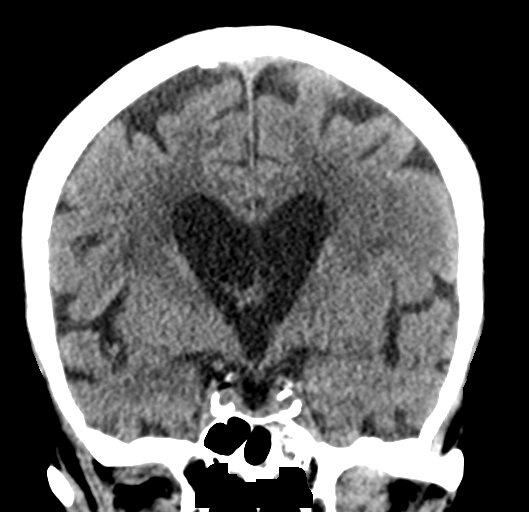

[Series 5: sagittal soft tissue · sagittal · 0.29mm/px · 3 of 48 slices shown]
[im 16/48  brain]
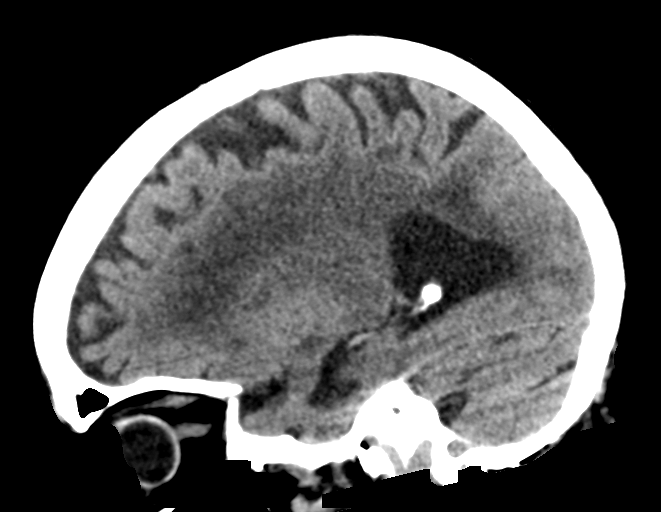
[im 24/48  brain]
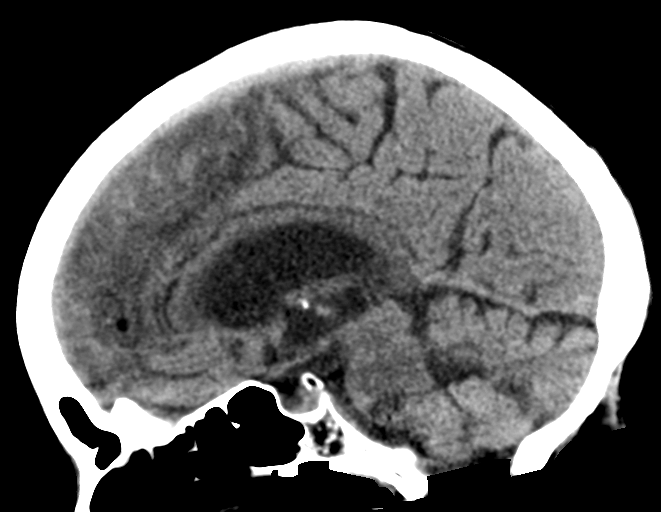
[im 32/48  brain]
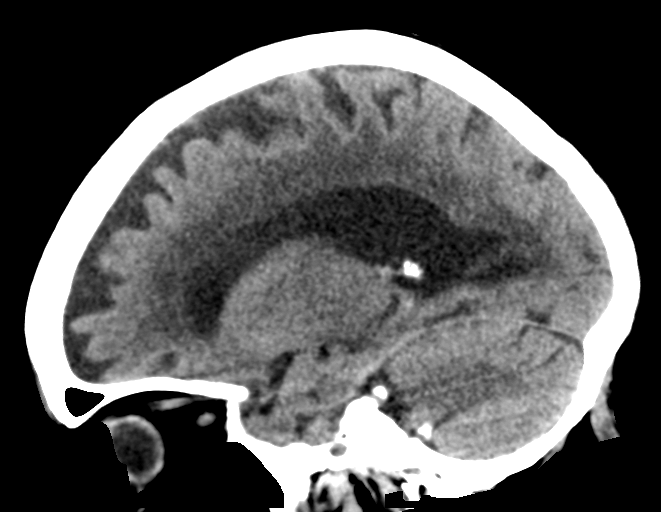

[16 of 47 positions shown; findings below may reference images not displayed]

FINDINGS: Brain: Generalized atrophy with mild ex vacuo dilatation of the
ventricular system. No midline shift or mass effect. Small vessel
chronic ischemic changes of deep cerebral white matter. No
intracranial hemorrhage, mass lesion or evidence of acute
infarction. No extra-axial fluid collections.

Vascular: Atherosclerotic calcification of internal carotid arteries
at skull base

Skull: Demineralized but intact

Sinuses/Orbits: Partial opacification of ethmoid air cells
bilaterally as well as inferior RIGHT mastoid air cells, unchanged.
Nasal septal deviation to the LEFT.

Other: N/A
IMPRESSION: Atrophy with small vessel chronic ischemic changes of deep cerebral
white matter.

No acute intracranial abnormalities.

Mild chronic sinus changes as above.
